# Patient Record
Sex: Female | Born: 1945 | Race: Black or African American | Hispanic: No | Smoking: Former smoker
Health system: Southern US, Community
[De-identification: ages and names within clinical notes are randomized; demographics above are authoritative.]

## PROBLEM LIST (undated history)

## (undated) DIAGNOSIS — B9620 Unspecified Escherichia coli [E. coli] as the cause of diseases classified elsewhere: Secondary | ICD-10-CM

## (undated) DIAGNOSIS — J80 Acute respiratory distress syndrome: Secondary | ICD-10-CM

## (undated) DIAGNOSIS — N12 Tubulo-interstitial nephritis, not specified as acute or chronic: Secondary | ICD-10-CM

## (undated) DIAGNOSIS — I10 Essential (primary) hypertension: Secondary | ICD-10-CM

## (undated) HISTORY — PX: CHOLECYSTECTOMY: SHX55

## (undated) HISTORY — PX: TUBAL LIGATION: SHX77

## (undated) HISTORY — DX: Tubulo-interstitial nephritis, not specified as acute or chronic: N12

## (undated) HISTORY — DX: Acute respiratory distress syndrome: J80

## (undated) HISTORY — DX: Unspecified Escherichia coli (E. coli) as the cause of diseases classified elsewhere: B96.20

## (undated) HISTORY — DX: Essential (primary) hypertension: I10

---

## 2001-03-09 ENCOUNTER — Encounter: Admission: RE | Admit: 2001-03-09 | Discharge: 2001-03-09 | Payer: Self-pay | Admitting: Otolaryngology

## 2001-03-09 ENCOUNTER — Encounter: Payer: Self-pay | Admitting: Otolaryngology

## 2005-10-10 HISTORY — PX: ROTATOR CUFF REPAIR: SHX139

## 2006-11-09 ENCOUNTER — Ambulatory Visit (HOSPITAL_COMMUNITY): Admission: RE | Admit: 2006-11-09 | Discharge: 2006-11-09 | Payer: Self-pay | Admitting: Pulmonary Disease

## 2006-11-09 ENCOUNTER — Encounter: Payer: Self-pay | Admitting: Orthopedic Surgery

## 2008-04-30 ENCOUNTER — Ambulatory Visit (HOSPITAL_COMMUNITY): Admission: RE | Admit: 2008-04-30 | Discharge: 2008-04-30 | Payer: Self-pay | Admitting: Internal Medicine

## 2008-05-06 ENCOUNTER — Ambulatory Visit (HOSPITAL_COMMUNITY): Admission: RE | Admit: 2008-05-06 | Discharge: 2008-05-06 | Payer: Self-pay | Admitting: Gastroenterology

## 2008-05-06 ENCOUNTER — Ambulatory Visit: Payer: Self-pay | Admitting: Gastroenterology

## 2008-10-10 DIAGNOSIS — J80 Acute respiratory distress syndrome: Secondary | ICD-10-CM

## 2008-10-10 DIAGNOSIS — B9620 Unspecified Escherichia coli [E. coli] as the cause of diseases classified elsewhere: Secondary | ICD-10-CM

## 2008-10-10 HISTORY — PX: TRACHEOSTOMY TUBE PLACEMENT: SHX814

## 2008-10-10 HISTORY — PX: TRACHEOSTOMY CLOSURE: SHX458

## 2008-10-10 HISTORY — DX: Acute respiratory distress syndrome: J80

## 2008-10-10 HISTORY — DX: Unspecified Escherichia coli (E. coli) as the cause of diseases classified elsewhere: B96.20

## 2009-09-01 ENCOUNTER — Inpatient Hospital Stay (HOSPITAL_COMMUNITY): Admission: AD | Admit: 2009-09-01 | Discharge: 2009-09-18 | Payer: Self-pay

## 2009-09-02 ENCOUNTER — Ambulatory Visit: Payer: Self-pay | Admitting: Emergency Medicine

## 2009-09-02 ENCOUNTER — Ambulatory Visit: Payer: Self-pay | Admitting: Internal Medicine

## 2009-09-08 ENCOUNTER — Ambulatory Visit: Payer: Self-pay | Admitting: Physical Medicine & Rehabilitation

## 2009-09-08 ENCOUNTER — Encounter: Payer: Self-pay | Admitting: Pulmonary Disease

## 2009-09-09 ENCOUNTER — Encounter: Payer: Self-pay | Admitting: Orthopedic Surgery

## 2009-09-09 ENCOUNTER — Ambulatory Visit: Payer: Self-pay | Admitting: Vascular Surgery

## 2009-09-14 ENCOUNTER — Encounter: Payer: Self-pay | Admitting: Internal Medicine

## 2009-09-18 ENCOUNTER — Encounter: Payer: Self-pay | Admitting: Orthopedic Surgery

## 2009-09-21 ENCOUNTER — Encounter: Payer: Self-pay | Admitting: Pulmonary Disease

## 2009-09-29 DIAGNOSIS — D696 Thrombocytopenia, unspecified: Secondary | ICD-10-CM | POA: Insufficient documentation

## 2009-09-29 DIAGNOSIS — E870 Hyperosmolality and hypernatremia: Secondary | ICD-10-CM | POA: Insufficient documentation

## 2009-09-29 DIAGNOSIS — E559 Vitamin D deficiency, unspecified: Secondary | ICD-10-CM | POA: Insufficient documentation

## 2009-09-29 DIAGNOSIS — N12 Tubulo-interstitial nephritis, not specified as acute or chronic: Secondary | ICD-10-CM | POA: Insufficient documentation

## 2009-09-29 DIAGNOSIS — J984 Other disorders of lung: Secondary | ICD-10-CM | POA: Insufficient documentation

## 2009-09-30 ENCOUNTER — Ambulatory Visit: Payer: Self-pay | Admitting: Pulmonary Disease

## 2009-10-12 ENCOUNTER — Telehealth: Payer: Self-pay | Admitting: Pulmonary Disease

## 2009-10-14 ENCOUNTER — Encounter: Payer: Self-pay | Admitting: Pulmonary Disease

## 2009-11-03 ENCOUNTER — Ambulatory Visit (HOSPITAL_COMMUNITY): Admission: RE | Admit: 2009-11-03 | Discharge: 2009-11-03 | Payer: Self-pay | Admitting: Urology

## 2009-11-18 ENCOUNTER — Ambulatory Visit (HOSPITAL_COMMUNITY): Admission: RE | Admit: 2009-11-18 | Discharge: 2009-11-18 | Payer: Self-pay | Admitting: Urology

## 2010-02-25 ENCOUNTER — Encounter: Payer: Self-pay | Admitting: Orthopedic Surgery

## 2010-03-04 ENCOUNTER — Ambulatory Visit: Payer: Self-pay | Admitting: Orthopedic Surgery

## 2010-03-04 DIAGNOSIS — M24539 Contracture, unspecified wrist: Secondary | ICD-10-CM

## 2010-03-04 DIAGNOSIS — M7580 Other shoulder lesions, unspecified shoulder: Secondary | ICD-10-CM

## 2010-03-11 ENCOUNTER — Encounter: Payer: Self-pay | Admitting: Orthopedic Surgery

## 2010-06-07 ENCOUNTER — Ambulatory Visit: Payer: Self-pay | Admitting: Orthopedic Surgery

## 2010-06-07 DIAGNOSIS — M75120 Complete rotator cuff tear or rupture of unspecified shoulder, not specified as traumatic: Secondary | ICD-10-CM | POA: Insufficient documentation

## 2010-06-15 ENCOUNTER — Encounter: Payer: Self-pay | Admitting: Orthopedic Surgery

## 2010-06-17 ENCOUNTER — Ambulatory Visit (HOSPITAL_COMMUNITY): Admission: RE | Admit: 2010-06-17 | Discharge: 2010-06-17 | Payer: Self-pay | Admitting: Orthopedic Surgery

## 2010-06-23 ENCOUNTER — Encounter: Payer: Self-pay | Admitting: Orthopedic Surgery

## 2010-06-24 ENCOUNTER — Ambulatory Visit: Payer: Self-pay | Admitting: Orthopedic Surgery

## 2010-06-25 ENCOUNTER — Encounter: Payer: Self-pay | Admitting: Orthopedic Surgery

## 2010-06-29 ENCOUNTER — Telehealth: Payer: Self-pay | Admitting: Orthopedic Surgery

## 2010-07-06 ENCOUNTER — Encounter: Payer: Self-pay | Admitting: Orthopedic Surgery

## 2010-07-29 ENCOUNTER — Encounter: Payer: Self-pay | Admitting: Orthopedic Surgery

## 2010-08-06 ENCOUNTER — Ambulatory Visit (HOSPITAL_BASED_OUTPATIENT_CLINIC_OR_DEPARTMENT_OTHER): Admission: RE | Admit: 2010-08-06 | Discharge: 2010-08-07 | Payer: Self-pay | Admitting: Orthopedic Surgery

## 2010-09-22 ENCOUNTER — Encounter (HOSPITAL_COMMUNITY)
Admission: RE | Admit: 2010-09-22 | Discharge: 2010-10-22 | Payer: Self-pay | Source: Home / Self Care | Attending: Orthopedic Surgery | Admitting: Orthopedic Surgery

## 2010-10-25 ENCOUNTER — Encounter
Admission: RE | Admit: 2010-10-25 | Discharge: 2010-11-09 | Payer: Self-pay | Source: Home / Self Care | Attending: Orthopedic Surgery | Admitting: Orthopedic Surgery

## 2010-10-31 ENCOUNTER — Encounter: Payer: Self-pay | Admitting: Internal Medicine

## 2010-11-09 NOTE — Assessment & Plan Note (Signed)
Summary: SAME PROBLEM,HAND,SHOULDER STILL HURTING/BCBS/BSF   Visit Type:  Follow-up Referring Provider:  Dr. Dwana Melena Primary Provider:  Dr. Catalina Pizza  CC:  right shoulder pain.  History of Present Illness: I saw Cheyenne Smith in the office today for a followup visit.  She is a 65 years old woman with the complaint of:  right shoulder pain.  Last seen 03-04-10 and received a cortisone injection for Impingement Syndrome. She was also sent for PT.  Medications: Azithromycin 250 mg, Tramadol 50 mg, Ciprofloxacin 500 mg.  Patient states that she does not want another shot, she says the shot did help some and therapy is not helping at all.  The patient has had I nerve conduction study which was normal she also had a cervical spine x-ray which showed mild degenerative cervical spondylosis C4 and 5 as well as C5 and 6.  Her shoulder films show a.c. joint arthritis.  This problem began after an IV infiltration in the hospital at [facility].  The full-thickness wound to the RIGHT arm no debridement was done and that was healed by secondary intention.  Her symptoms seem to be continuing so I would think an MRI is needed  Allergies: 1)  ! Penicillin  Physical Exam  Additional Exam:  RIGHT shoulder painful head elevation range of motion 120 active 160 passive.  Tenderness in the anterolateral acromion.  Shoulder is stable.  Strength in the cuff mild weakness.  Skin intact over the shoulder.  She's x3 mood and affect are normal overall appearance was normal.     Impression & Recommendations:  Problem # 1:  IMPINGEMENT SYNDROME (ICD-726.2) Assessment Deteriorated reviewed OT PT notes   Problem # 2:  ROTATOR CUFF TEAR (ICD-727.61) Assessment: New  Orders: Est. Patient Level III ([address])  Medications Added to Medication List This Visit: 1)  Norco 5-325 Mg Tabs (Hydrocodone-acetaminophen) .Marland Kitchen.. 1 q 6 h as needed pain  Patient Instructions: 1)  MRI Rt shoulder Rotator cuff tear   2)  refer back to Dr. Edilia Bo for re-evaluation of the right hand and forearm  3)  return after MRI  Prescriptions: NORCO 5-325 MG TABS (HYDROCODONE-ACETAMINOPHEN) 1 q 6 h as needed pain  #30 x 1   Entered and Authorized by:   Fuller Canada MD   Signed by:   Fuller Canada MD on 06/07/2010   Method used:   Print then Give to Patient   RxID:   [id number]

## 2010-11-09 NOTE — Progress Notes (Signed)
Summary: referral/ wound center  Phone Note Call from Patient   Caller: Patient Call For: alva Summary of Call: pt's friend called (on pt's behalf). says they need a phone # w/ dr's name- referral to wound center. says pt went to someone that she didn't like (says it wasn't the wound center). call marilyn [phone] Initial call taken by: Tivis Ringer,  October 12, 2009 10:50 AM  Follow-up for Phone Call        [facility] Carron Curie CMA  October 12, 2009 2:13 PM  spoke to pt and she states that she went to [facility]ound Center in [place] and needs the phone number.  I gave her the number [phone]. Carron Curie CMA  October 14, 2009 4:25 PM

## 2010-11-09 NOTE — Letter (Signed)
Summary: Hospital consultation  Hospital consultation   Imported By: Jacklynn Ganong 03/04/2010 14:10:47  _____________________________________________________________________  External Attachment:    Type:   Image     Comment:   External Document

## 2010-11-09 NOTE — Miscellaneous (Signed)
Summary: PT order  PT order   Imported By: Cammie Sickle 03/17/2010 17:53:08  _____________________________________________________________________  External Attachment:    Type:   Image     Comment:   External Document

## 2010-11-09 NOTE — Consult Note (Signed)
Summary: Consult note from Dr. Dion Saucier  Consult note from Dr. Dion Saucier   Imported By: Jacklynn Ganong 07/06/2010 11:01:55  _____________________________________________________________________  External Attachment:    Type:   Image     Comment:   External Document

## 2010-11-09 NOTE — Assessment & Plan Note (Signed)
Summary: mri results rt shoulder bringing disc.bcbs.cbt   Visit Type:  Follow-up Referring Provider:  Dr. Dwana Melena Primary Provider:  Dr. Catalina Pizza  CC:  rt shoulder mri results.  History of Present Illness: I saw Cheyenne Smith in the office today for a followup visit.  She is a 65 years old woman with the complaint of:  right shoulder pain.  WHILE SHE WAS IN THE HOSPITAL AN IV NFILTRATED INTO HER ARM AND SHE SUSTAINED A CHEMICAL BURN WHICH WAS TREATED WITH DRESSING CHANGES AND  IV ANTIBIOTICS.   SHE CAME TO [place] WITH SHOULDER PAIN.  Last seen 03-04-10 and received a cortisone injection for Impingement Syndrome. She was also sent for PT.  Medications:  Norco 5 for pain, some relief.  Patient states that she does not want another shot, she says the shot did help some and therapy is not helping at all.  THE MRI SHOWS:  IMPRESSION: Large full-thickness retracted tear of the distal supraspinatus tendon with mild atrophy of the supraspinatus muscle.  Allergies: 1)  ! Penicillin   Impression & Recommendations:  Problem # 1:  ROTATOR CUFF TEAR (ICD-727.61) Assessment Unchanged I advised her to have surgery; she refuses to have it at [facility]. she wants to be referred out! Orders: Orthopedic Surgeon Referral (Ortho Surgeon) Est. Patient Level II [id number])  Medications Added to Medication List This Visit: 1)  Norco 7.5-325 Mg Tabs (Hydrocodone-acetaminophen) .Marland Kitchen.. 1 by mouth q 4 hrs as needed pain  Patient Instructions: 1)  Refer to Dr. Dion Saucier for shoulder RCT needs surgery, wants [facility] instead of [facility] 2)  Refer to Dr. Amanda Pea for hand pain Prescriptions: NORCO 7.5-325 MG TABS (HYDROCODONE-ACETAMINOPHEN) 1 by mouth q 4 hrs as needed pain  #60 x 1   Entered and Authorized by:   Fuller Canada MD   Signed by:   Fuller Canada MD on 06/24/2010   Method used:   Print then Give to Patient   RxID:   [id number]

## 2010-11-09 NOTE — Letter (Signed)
Summary: Discharge Summary  Discharge Summary   Imported By: Jacklynn Ganong 03/04/2010 14:11:56  _____________________________________________________________________  External Attachment:    Type:   Image     Comment:   External Document

## 2010-11-09 NOTE — Letter (Signed)
Summary: *Orthopedic Consult Note  [facility]  [address]  [place], [place] [address]   Phone: [phone]  Fax: [phone]    Re:    Cheyenne Smith DOB:    July 23, 1946   Dear: Timothy Lasso   Thank you for requesting that we see the above patient for consultation.  A copy of the detailed office note will be sent under separate cover, for your review.  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  No followup needed.  Do not anticipate any surgery needed.       Thank you for this opportunity to look after your patient.  Sincerely,   Terrance Mass. MD.

## 2010-11-09 NOTE — Miscellaneous (Signed)
Summary: Hand therapy progress note  Hand therapy progress note   Imported By: Jacklynn Ganong 06/23/2010 09:14:13  _____________________________________________________________________  External Attachment:    Type:   Image     Comment:   External Document

## 2010-11-09 NOTE — Letter (Signed)
Summary: [facility]  [facility]   Imported By: Cammie Sickle 03/04/2010 12:21:06  _____________________________________________________________________  External Attachment:    Type:   Image     Comment:   External Document

## 2010-11-09 NOTE — Miscellaneous (Signed)
Summary: mri appt scheduled [facility] 06/17/10 7:40am  Clinical Lists Changes  u for results 06/24/10 at 11:15am to bring disc, precert number [id number] expires 30 days from 06/15/10, i scheduled mri for 06/17/10 at 7:40am

## 2010-11-09 NOTE — Progress Notes (Signed)
Summary: continue hand theraph?  Phone Note Call from Patient   Summary of Call: Cheyenne Smith (07/14/1946) is being referred to Dr. Dion Saucier for RTC and Dr Amanda Pea for her hand.  She wants to know if she is to continue the hand therapy. Her # [phone] Initial call taken by: Jacklynn Ganong,  June 29, 2010 11:35 AM  Follow-up for Phone Call        stop for now pending eval by dr Butler Denmark  Follow-up by: Fuller Canada MD,  June 29, 2010 4:09 PM  Additional Follow-up for Phone Call Additional follow up Details #1::        Patient advised, also [facility] with [facility] Additional Follow-up by: Jacklynn Ganong,  June 29, 2010 4:12 PM

## 2010-11-09 NOTE — Letter (Signed)
Summary: History form  History form   Imported By: Jacklynn Ganong 03/04/2010 14:09:18  _____________________________________________________________________  External Attachment:    Type:   Image     Comment:   External Document

## 2010-11-09 NOTE — Miscellaneous (Signed)
Summary: Rehab Report Initial   Rehab Report Initial   Imported By: Cammie Sickle 04/06/2010 10:28:13  _____________________________________________________________________  External Attachment:    Type:   Image     Comment:   External Document

## 2010-11-09 NOTE — Letter (Signed)
Summary: Medical record request Disab Determination  Medical record request Disab Determination   Imported By: Cammie Sickle 07/05/2010 12:06:47  _____________________________________________________________________  External Attachment:    Type:   Image     Comment:   External Document

## 2010-11-09 NOTE — Miscellaneous (Signed)
Summary: Care Plan/[facility]  Care Plan/[facility]   Imported By: Lester Gretna 10/19/2009 10:51:54  _____________________________________________________________________  External Attachment:    Type:   Image     Comment:   External Document

## 2010-11-09 NOTE — Miscellaneous (Signed)
Summary: hand therapy discharge  hand therapy discharge   Imported By: Jacklynn Ganong 08/04/2010 16:15:39  _____________________________________________________________________  External Attachment:    Type:   Image     Comment:   External Document

## 2010-11-09 NOTE — Assessment & Plan Note (Signed)
Summary: RT SHOULDER PAIN/NEEDS XRAY/REF Z.HALL/CAF   Vital Signs:  Patient profile:   65 year old female Height:      66 inches Weight:      174 pounds Pulse rate:   68 / minute Resp:     16 per minute  Vitals Entered By: Fuller Canada MD (Mar 04, 2010 9:37 AM)  Visit Type:  Initial Consult Referring Provider:  Dr. Dwana Melena Primary Provider:  Dr. Catalina Pizza  CC:  right shoulder pain.  History of Present Illness: I saw Cheyenne Smith in the office today for an initial visit.  She is a 65 years old woman with the complaint of:  right shoulder pain.  Interesting history on this patient she had infiltration of the medication in the hospital which resulted in wound in the RIGHT upper extremity which required treatment and then subsequent to that had stiffness and pain developed in the RIGHT upper extremity including the RIGHT shoulder.  She now complains of pain in the RIGHT upper extremity including the hand arm elbow shoulder areas with 7/10 in intensity which is constant worse when she moves her arm or hand or tries to elevate the shoulder.  She has some numbness and tingling but no swelling.  She reportedly had I nerve conduction study which was normal [facility]   RIGHT elbow x-ray which showed some degenerative changes this was done in 2008; cervical spine film at that time which showed mild degenerative cervical spondylosis at C4-C5 and C5 and 6, she had a shoulder film showed mild a.c. joint degenerative change lateral downsloping acromion with undersurface spurring of the acromion.  We see from a consultation done in 2010 which is related to her forearm wounds from the infiltration of vasopressin  These wounds were full thickness wound of the RIGHT forearm no debridement was done it was allowed to heal by secondary intention  Discharge summary indicates she had acute respiratory failure respiratory distress syndrome hypernatremia  dysphasia acute pyelonephritis with sepsis and anemia with thrombocytopenia    xrays today in our office.  Neck and rt shoulder films taken at [facility] 2008 for review.  Meds: Vicodin 5 and Cipro 500.  Notes from hospital from 08/2009 for review.  Allergies: 1)  ! Penicillin  Past History:  Past Surgical History: cholectystectomy in the 1980s bilateral tubal ligation cystostopy left pyelogram left stent placement in kidney  Family History: HTN: mother  Family History of Diabetes Family History Coronary Heart Disease female < 108 Family History of Arthritis Hx, family, asthma Hx, family, kidney disease NEC  Social History: Married Pt lives in [place]. Pt has 4 sons. Pt works as a Engineer, petroleum at [employer]. Patient states former smoker.  no alcohol use 2 cups of coffee twice per week.  Review of Systems Constitutional:  Denies weight loss, weight gain, fever, chills, and fatigue. Cardiovascular:  Denies chest pain, palpitations, fainting, and murmurs. Respiratory:  Denies short of breath, wheezing, couch, tightness, pain on inspiration, and snoring . Gastrointestinal:  Denies heartburn, nausea, vomiting, diarrhea, constipation, and blood in your stools. Genitourinary:  Denies frequency, urgency, difficulty urinating, painful urination, flank pain, and bleeding in urine. Neurologic:  Denies numbness, tingling, unsteady gait, dizziness, tremors, and seizure. Musculoskeletal:  Denies joint pain, swelling, instability, stiffness, redness, heat, and muscle pain. Endocrine:  Denies excessive thirst, exessive urination, and heat or cold intolerance. Psychiatric:  Denies nervousness, depression, anxiety, and hallucinations. Skin:  Denies changes  in the skin, poor healing, rash, itching, and redness. HEENT:  Denies blurred or double vision, eye pain, redness, and watering. Immunology:  Complains of seasonal allergies; denies sinus problems and allergic to  bee stings. Hemoatologic:  Denies easy bleeding and brusing.  Physical Exam  Skin:  the skin and the shoulder area normal RIGHT forearm shows 2 stellate-type wounds which have healed by secondary intention or non-tender negative Tinel's Cervical Nodes:  no significant adenopathy Axillary Nodes:  no significant adenopathy Psych:  alert and cooperative; normal mood and affect; normal attention span and concentration   Shoulder/Elbow Exam  General:    Well-developed, well-nourished, normal body habitus; no deformities, normal grooming.    Inspection:    no swelling ecchymosis or deformity around the shoulder or forearm or elbow or wrist or hand  Palpation:    tenderness around the RIGHT shoulder area primarily around the deltoid area  Vascular:    Radial, ulnar, brachial, and axillary pulses 2+ and symmetric; capillary refill less than 2 seconds; no evidence of ischemia, clubbing, or cyanosis.    Sensory:    Gross sensation intact in the upper extremities.    Motor:    there is weakness in wrist flexion normal elbow flexion extension power weakness in abduction weakness in external rotation and weakness in forward elevation RIGHT shoulder    Reflexes:    Normal reflexes in the upper extremities.    Shoulder Exam:    Right:    Inspection:  Abnormal    Palpation:  Abnormal    Stability:  stable    internal rotation to the front pocket    Range of Motion:       Flexion-Active: 70       External Rotation : 35       Interior Rotation :      Left:    Inspection:  Normal    Palpation:  Normal  Impingement Sign NEER:    Right positive Sulcus Sign:    Right negative   Impression & Recommendations:  Problem # 1:  IMPINGEMENT SYNDROME (ICD-726.2) Assessment New  I injected the subacromial space for bursitis, impingement, tendinitis  She also has contracture and adhesive capsulitis which will require physical therapy, no surgery needed  Orders: Consultation Level  III (28413) Depo- Medrol 40mg  (J1030) Joint Aspirate / Injection, Large (20610)  Problem # 2:  CONTRACTURE OF FOREARM JOINT (KGM-010.27) Assessment: New  Orders: Consultation Level III (25366)  Problem # 3:  CONTRACTURE OF HAND JOINT (YQI-347.42) Assessment: New  Orders: Consultation Level III (59563)  Patient Instructions: 1)  OT and PT  2)  You have received an injection of cortisone today. You may experience increased pain at the injection site. Apply ice pack to the area for 20 minutes every 2 hours and take 2 xtra strength tylenol every 8 hours. This increased pain will usually resolve in 24 hours. The injection will take effect in 3-10 days.  3)  Please schedule a follow-up appointment as needed. 4)  Limit activity to comfort and avoid activities that increase discomfort.  Apply moist heat and/or ice to shoulder and take medication as instructed for pain relief. Please read the Shoulder Pain Handout and start Physical Therapy as directed.

## 2010-11-09 NOTE — Progress Notes (Signed)
Summary: Referral to Dr. Dion Saucier.  Phone Note Outgoing Call   Call placed by: Waldon Reining,  June 29, 2010 10:55 AM Call placed to: Specialist Action Taken: Information Sent Summary of Call: I faxed a referral for this patient to Dr. Dion Saucier to be seen for rotator cuff tear.

## 2010-11-12 ENCOUNTER — Ambulatory Visit (HOSPITAL_COMMUNITY)
Admission: RE | Admit: 2010-11-12 | Discharge: 2010-11-12 | Disposition: A | Discharge status: Home / Self Care | Payer: BC Managed Care – PPO | Source: Ambulatory Visit | Attending: Orthopedic Surgery | Admitting: Orthopedic Surgery

## 2010-11-16 ENCOUNTER — Ambulatory Visit (HOSPITAL_COMMUNITY)
Admission: RE | Admit: 2010-11-16 | Discharge: 2010-11-16 | Disposition: A | Discharge status: Home / Self Care | Payer: BC Managed Care – PPO | Source: Ambulatory Visit | Attending: Orthopedic Surgery | Admitting: Orthopedic Surgery

## 2010-11-16 DIAGNOSIS — M6281 Muscle weakness (generalized): Secondary | ICD-10-CM | POA: Insufficient documentation

## 2010-11-16 DIAGNOSIS — M25619 Stiffness of unspecified shoulder, not elsewhere classified: Secondary | ICD-10-CM | POA: Insufficient documentation

## 2010-11-16 DIAGNOSIS — IMO0001 Reserved for inherently not codable concepts without codable children: Secondary | ICD-10-CM | POA: Insufficient documentation

## 2010-11-16 DIAGNOSIS — M25519 Pain in unspecified shoulder: Secondary | ICD-10-CM | POA: Insufficient documentation

## 2010-11-19 ENCOUNTER — Ambulatory Visit (HOSPITAL_COMMUNITY)
Admission: RE | Admit: 2010-11-19 | Discharge: 2010-11-19 | Disposition: A | Discharge status: Home / Self Care | Payer: BC Managed Care – PPO | Source: Ambulatory Visit | Attending: Urology | Admitting: Urology

## 2010-11-19 DIAGNOSIS — IMO0001 Reserved for inherently not codable concepts without codable children: Secondary | ICD-10-CM | POA: Insufficient documentation

## 2010-11-19 DIAGNOSIS — M6281 Muscle weakness (generalized): Secondary | ICD-10-CM | POA: Insufficient documentation

## 2010-11-19 DIAGNOSIS — M25619 Stiffness of unspecified shoulder, not elsewhere classified: Secondary | ICD-10-CM | POA: Insufficient documentation

## 2010-11-19 DIAGNOSIS — M25519 Pain in unspecified shoulder: Secondary | ICD-10-CM | POA: Insufficient documentation

## 2010-11-23 ENCOUNTER — Ambulatory Visit (HOSPITAL_COMMUNITY)
Admission: RE | Admit: 2010-11-23 | Discharge: 2010-11-23 | Disposition: A | Discharge status: Home / Self Care | Payer: BC Managed Care – PPO | Source: Ambulatory Visit | Attending: Orthopedic Surgery | Admitting: Orthopedic Surgery

## 2010-11-23 DIAGNOSIS — IMO0001 Reserved for inherently not codable concepts without codable children: Secondary | ICD-10-CM | POA: Insufficient documentation

## 2010-11-23 DIAGNOSIS — M25619 Stiffness of unspecified shoulder, not elsewhere classified: Secondary | ICD-10-CM | POA: Insufficient documentation

## 2010-11-23 DIAGNOSIS — M6281 Muscle weakness (generalized): Secondary | ICD-10-CM | POA: Insufficient documentation

## 2010-11-23 DIAGNOSIS — M25519 Pain in unspecified shoulder: Secondary | ICD-10-CM | POA: Insufficient documentation

## 2010-11-26 ENCOUNTER — Ambulatory Visit (HOSPITAL_COMMUNITY)
Admission: RE | Admit: 2010-11-26 | Discharge: 2010-11-26 | Disposition: A | Discharge status: Home / Self Care | Payer: BC Managed Care – PPO | Source: Ambulatory Visit | Attending: Orthopedic Surgery | Admitting: Orthopedic Surgery

## 2010-11-26 DIAGNOSIS — M25519 Pain in unspecified shoulder: Secondary | ICD-10-CM | POA: Insufficient documentation

## 2010-11-26 DIAGNOSIS — M6281 Muscle weakness (generalized): Secondary | ICD-10-CM | POA: Insufficient documentation

## 2010-11-26 DIAGNOSIS — IMO0001 Reserved for inherently not codable concepts without codable children: Secondary | ICD-10-CM | POA: Insufficient documentation

## 2010-11-26 DIAGNOSIS — M25619 Stiffness of unspecified shoulder, not elsewhere classified: Secondary | ICD-10-CM | POA: Insufficient documentation

## 2010-11-30 ENCOUNTER — Ambulatory Visit (HOSPITAL_COMMUNITY)
Admission: RE | Admit: 2010-11-30 | Discharge: 2010-11-30 | Disposition: A | Discharge status: Home / Self Care | Payer: BC Managed Care – PPO | Source: Ambulatory Visit | Attending: *Deleted | Admitting: *Deleted

## 2010-12-07 ENCOUNTER — Ambulatory Visit (HOSPITAL_COMMUNITY)
Admission: RE | Admit: 2010-12-07 | Discharge: 2010-12-07 | Disposition: A | Discharge status: Home / Self Care | Payer: BC Managed Care – PPO | Source: Ambulatory Visit | Attending: Orthopedic Surgery | Admitting: Orthopedic Surgery

## 2010-12-09 ENCOUNTER — Ambulatory Visit (HOSPITAL_COMMUNITY)
Admission: RE | Admit: 2010-12-09 | Discharge: 2010-12-09 | Disposition: A | Discharge status: Home / Self Care | Payer: BC Managed Care – PPO | Source: Ambulatory Visit | Attending: Orthopedic Surgery | Admitting: Orthopedic Surgery

## 2010-12-09 DIAGNOSIS — Z5189 Encounter for other specified aftercare: Secondary | ICD-10-CM | POA: Insufficient documentation

## 2010-12-09 DIAGNOSIS — M25519 Pain in unspecified shoulder: Secondary | ICD-10-CM | POA: Insufficient documentation

## 2010-12-09 DIAGNOSIS — M6281 Muscle weakness (generalized): Secondary | ICD-10-CM | POA: Insufficient documentation

## 2010-12-09 DIAGNOSIS — M25619 Stiffness of unspecified shoulder, not elsewhere classified: Secondary | ICD-10-CM | POA: Insufficient documentation

## 2010-12-14 ENCOUNTER — Ambulatory Visit (HOSPITAL_COMMUNITY)
Admission: RE | Admit: 2010-12-14 | Discharge: 2010-12-14 | Disposition: A | Discharge status: Home / Self Care | Payer: BC Managed Care – PPO | Source: Ambulatory Visit | Attending: Orthopedic Surgery | Admitting: Orthopedic Surgery

## 2010-12-16 ENCOUNTER — Ambulatory Visit (HOSPITAL_COMMUNITY)
Admission: RE | Admit: 2010-12-16 | Discharge: 2010-12-16 | Disposition: A | Discharge status: Home / Self Care | Payer: BC Managed Care – PPO | Source: Ambulatory Visit | Attending: Orthopedic Surgery | Admitting: Orthopedic Surgery

## 2010-12-21 ENCOUNTER — Ambulatory Visit (HOSPITAL_COMMUNITY)
Admission: RE | Admit: 2010-12-21 | Discharge: 2010-12-21 | Disposition: A | Discharge status: Home / Self Care | Payer: BC Managed Care – PPO | Source: Ambulatory Visit | Attending: Orthopedic Surgery | Admitting: Orthopedic Surgery

## 2010-12-22 LAB — POCT HEMOGLOBIN-HEMACUE: Hemoglobin: 12.8 g/dL (ref 12.0–15.0)

## 2010-12-23 ENCOUNTER — Ambulatory Visit (HOSPITAL_COMMUNITY)
Admission: RE | Admit: 2010-12-23 | Discharge: 2010-12-23 | Disposition: A | Discharge status: Home / Self Care | Payer: BC Managed Care – PPO | Source: Ambulatory Visit | Attending: *Deleted | Admitting: *Deleted

## 2010-12-29 ENCOUNTER — Ambulatory Visit (HOSPITAL_COMMUNITY)
Admission: RE | Admit: 2010-12-29 | Discharge: 2010-12-29 | Disposition: A | Discharge status: Home / Self Care | Payer: BC Managed Care – PPO | Source: Ambulatory Visit | Attending: *Deleted | Admitting: *Deleted

## 2010-12-30 ENCOUNTER — Ambulatory Visit (HOSPITAL_COMMUNITY)
Admission: RE | Admit: 2010-12-30 | Discharge: 2010-12-30 | Disposition: A | Discharge status: Home / Self Care | Payer: BC Managed Care – PPO | Source: Ambulatory Visit | Attending: *Deleted | Admitting: *Deleted

## 2010-12-30 LAB — BASIC METABOLIC PANEL
GFR calc Af Amer: >60 mL/min (ref >60)
GFR calc non Af Amer: >60 mL/min (ref >60)
Potassium: 3.3 mEq/L — ABNORMAL LOW (ref 3.5–5.1)
Sodium: 138 mEq/L (ref 135–145)

## 2010-12-30 LAB — CBC
HCT: 32.3 % — ABNORMAL LOW (ref 36.0–46.0)
Hemoglobin: 10.9 g/dL — ABNORMAL LOW (ref 12.0–15.0)
WBC: 6.3 10*3/uL (ref 4.0–10.5)

## 2011-01-04 ENCOUNTER — Ambulatory Visit (HOSPITAL_COMMUNITY)
Admission: RE | Admit: 2011-01-04 | Discharge: 2011-01-04 | Disposition: A | Discharge status: Home / Self Care | Payer: BC Managed Care – PPO | Source: Ambulatory Visit | Attending: *Deleted | Admitting: *Deleted

## 2011-01-06 ENCOUNTER — Ambulatory Visit (HOSPITAL_COMMUNITY)
Admission: RE | Admit: 2011-01-06 | Discharge: 2011-01-06 | Disposition: A | Discharge status: Home / Self Care | Payer: BC Managed Care – PPO | Source: Ambulatory Visit | Attending: *Deleted | Admitting: *Deleted

## 2011-01-11 ENCOUNTER — Ambulatory Visit (HOSPITAL_COMMUNITY)
Admission: RE | Admit: 2011-01-11 | Discharge: 2011-01-11 | Disposition: A | Discharge status: Home / Self Care | Payer: BC Managed Care – PPO | Source: Ambulatory Visit | Attending: Orthopedic Surgery | Admitting: Orthopedic Surgery

## 2011-01-11 DIAGNOSIS — M25619 Stiffness of unspecified shoulder, not elsewhere classified: Secondary | ICD-10-CM | POA: Insufficient documentation

## 2011-01-11 DIAGNOSIS — Z5189 Encounter for other specified aftercare: Secondary | ICD-10-CM | POA: Insufficient documentation

## 2011-01-11 DIAGNOSIS — M6281 Muscle weakness (generalized): Secondary | ICD-10-CM | POA: Insufficient documentation

## 2011-01-11 DIAGNOSIS — M25519 Pain in unspecified shoulder: Secondary | ICD-10-CM | POA: Insufficient documentation

## 2011-01-11 LAB — BASIC METABOLIC PANEL
BUN: 16 mg/dL (ref 6–23)
BUN: 25 mg/dL — ABNORMAL HIGH (ref 6–23)
BUN: 37 mg/dL — ABNORMAL HIGH (ref 6–23)
CO2: 24 mEq/L (ref 19–32)
CO2: 25 mEq/L (ref 19–32)
Calcium: 8.2 mg/dL — ABNORMAL LOW (ref 8.4–10.5)
Calcium: 8.4 mg/dL (ref 8.4–10.5)
Calcium: 8.6 mg/dL (ref 8.4–10.5)
Calcium: 8.7 mg/dL (ref 8.4–10.5)
Chloride: 107 mEq/L (ref 96–112)
Chloride: 110 mEq/L (ref 96–112)
Chloride: 113 mEq/L — ABNORMAL HIGH (ref 96–112)
Creatinine, Ser: 0.85 mg/dL (ref 0.4–1.2)
Creatinine, Ser: 0.89 mg/dL (ref 0.4–1.2)
Creatinine, Ser: 1.24 mg/dL — ABNORMAL HIGH (ref 0.4–1.2)
Creatinine, Ser: 1.39 mg/dL — ABNORMAL HIGH (ref 0.4–1.2)
GFR calc Af Amer: 46 mL/min — ABNORMAL LOW (ref >60)
GFR calc Af Amer: >60 mL/min (ref >60)
GFR calc Af Amer: >60 mL/min (ref >60)
GFR calc Af Amer: >60 mL/min (ref >60)
GFR calc Af Amer: >60 mL/min (ref >60)
GFR calc Af Amer: >60 mL/min (ref >60)
GFR calc non Af Amer: 38 mL/min — ABNORMAL LOW (ref >60)
GFR calc non Af Amer: 44 mL/min — ABNORMAL LOW (ref >60)
GFR calc non Af Amer: >60 mL/min (ref >60)
GFR calc non Af Amer: >60 mL/min (ref >60)
Glucose, Bld: 125 mg/dL — ABNORMAL HIGH (ref 70–99)
Potassium: 3 mEq/L — ABNORMAL LOW (ref 3.5–5.1)
Potassium: 3.1 mEq/L — ABNORMAL LOW (ref 3.5–5.1)
Potassium: 3.8 mEq/L (ref 3.5–5.1)
Potassium: 3.8 mEq/L (ref 3.5–5.1)
Potassium: 4.1 mEq/L (ref 3.5–5.1)
Sodium: 139 mEq/L (ref 135–145)
Sodium: 141 mEq/L (ref 135–145)
Sodium: 143 mEq/L (ref 135–145)
Sodium: 144 mEq/L (ref 135–145)

## 2011-01-11 LAB — CBC
HCT: 20.4 % — ABNORMAL LOW (ref 36.0–46.0)
HCT: 22.7 % — ABNORMAL LOW (ref 36.0–46.0)
HCT: 23.7 % — ABNORMAL LOW (ref 36.0–46.0)
HCT: 26.4 % — ABNORMAL LOW (ref 36.0–46.0)
Hemoglobin: 8 g/dL — ABNORMAL LOW (ref 12.0–15.0)
Hemoglobin: 8.8 g/dL — ABNORMAL LOW (ref 12.0–15.0)
MCHC: 33.3 g/dL (ref 30.0–36.0)
MCHC: 33.5 g/dL (ref 30.0–36.0)
MCV: 92.8 fL (ref 78.0–100.0)
MCV: 92.9 fL (ref 78.0–100.0)
Platelets: 148 10*3/uL — ABNORMAL LOW (ref 150–400)
Platelets: 167 10*3/uL (ref 150–400)
Platelets: 188 10*3/uL (ref 150–400)
Platelets: 381 10*3/uL (ref 150–400)
RBC: 2.34 MIL/uL — ABNORMAL LOW (ref 3.87–5.11)
RBC: 2.45 MIL/uL — ABNORMAL LOW (ref 3.87–5.11)
RBC: 2.47 MIL/uL — ABNORMAL LOW (ref 3.87–5.11)
RBC: 2.56 MIL/uL — ABNORMAL LOW (ref 3.87–5.11)
RBC: 2.56 MIL/uL — ABNORMAL LOW (ref 3.87–5.11)
RDW: 14.3 % (ref 11.5–15.5)
RDW: 16.3 % — ABNORMAL HIGH (ref 11.5–15.5)
WBC: 15.7 10*3/uL — ABNORMAL HIGH (ref 4.0–10.5)
WBC: 16.4 10*3/uL — ABNORMAL HIGH (ref 4.0–10.5)
WBC: 7.7 10*3/uL (ref 4.0–10.5)
WBC: 7.9 10*3/uL (ref 4.0–10.5)
WBC: 9.1 10*3/uL (ref 4.0–10.5)

## 2011-01-11 LAB — GLUCOSE, CAPILLARY
Glucose-Capillary: 104 mg/dL — ABNORMAL HIGH (ref 70–99)
Glucose-Capillary: 108 mg/dL — ABNORMAL HIGH (ref 70–99)
Glucose-Capillary: 111 mg/dL — ABNORMAL HIGH (ref 70–99)
Glucose-Capillary: 113 mg/dL — ABNORMAL HIGH (ref 70–99)
Glucose-Capillary: 120 mg/dL — ABNORMAL HIGH (ref 70–99)
Glucose-Capillary: 130 mg/dL — ABNORMAL HIGH (ref 70–99)
Glucose-Capillary: 98 mg/dL (ref 70–99)
Glucose-Capillary: 98 mg/dL (ref 70–99)

## 2011-01-11 LAB — MAGNESIUM
Magnesium: 1.5 mg/dL (ref 1.5–2.5)
Magnesium: 1.5 mg/dL (ref 1.5–2.5)

## 2011-01-11 LAB — BLOOD GAS, ARTERIAL
Bicarbonate: 28 mEq/L — ABNORMAL HIGH (ref 20.0–24.0)
FIO2: 0.28 %
O2 Saturation: 96.6 %
Patient temperature: 98.6
pH, Arterial: 7.472 — ABNORMAL HIGH (ref 7.350–7.400)

## 2011-01-11 LAB — IRON AND TIBC
Saturation Ratios: 9 % — ABNORMAL LOW (ref 20–55)
TIBC: 222 ug/dL — ABNORMAL LOW (ref 250–470)
UIBC: 201 ug/dL

## 2011-01-11 LAB — RETICULOCYTES: Retic Count, Absolute: 26.1 10*3/uL (ref 19.0–186.0)

## 2011-01-11 LAB — FOLATE: Folate: 10.7 ng/mL

## 2011-01-11 LAB — CLOSTRIDIUM DIFFICILE EIA
C difficile Toxins A+B, EIA: NEGATIVE
C difficile Toxins A+B, EIA: NEGATIVE

## 2011-01-11 LAB — PREPARE RBC (CROSSMATCH)

## 2011-01-11 LAB — FERRITIN: Ferritin: 372 ng/mL — ABNORMAL HIGH (ref 10–291)

## 2011-01-12 ENCOUNTER — Ambulatory Visit (HOSPITAL_COMMUNITY)
Admission: RE | Admit: 2011-01-12 | Discharge: 2011-01-12 | Disposition: A | Discharge status: Home / Self Care | Payer: BC Managed Care – PPO | Source: Ambulatory Visit | Admitting: Occupational Therapy

## 2011-01-12 LAB — FERRITIN: Ferritin: 839 ng/mL — ABNORMAL HIGH (ref 10–291)

## 2011-01-12 LAB — RETICULOCYTES
RBC.: 3.02 MIL/uL — ABNORMAL LOW (ref 3.87–5.11)
Retic Ct Pct: 1 % (ref 0.4–3.1)

## 2011-01-12 LAB — POCT I-STAT 3, ART BLOOD GAS (G3+)
Acid-Base Excess: 8 mmol/L — ABNORMAL HIGH (ref 0.0–2.0)
Acid-base deficit: 1 mmol/L (ref 0.0–2.0)
Acid-base deficit: 11 mmol/L — ABNORMAL HIGH (ref 0.0–2.0)
Acid-base deficit: 15 mmol/L — ABNORMAL HIGH (ref 0.0–2.0)
Acid-base deficit: 8 mmol/L — ABNORMAL HIGH (ref 0.0–2.0)
Bicarbonate: 16.8 mEq/L — ABNORMAL LOW (ref 20.0–24.0)
Bicarbonate: 22.1 mEq/L (ref 20.0–24.0)
O2 Saturation: 91 %
O2 Saturation: 98 %
O2 Saturation: 98 %
O2 Saturation: 98 %
Patient temperature: 97.6
Patient temperature: 97.7
Patient temperature: 97.8
Patient temperature: 98.6
Patient temperature: 98.6
TCO2: 14 mmol/L (ref 0–100)
TCO2: 23 mmol/L (ref 0–100)
TCO2: 32 mmol/L (ref 0–100)
pCO2 arterial: 29.2 mmHg — ABNORMAL LOW (ref 35.0–45.0)
pCO2 arterial: 32.7 mmHg — ABNORMAL LOW (ref 35.0–45.0)
pCO2 arterial: 33.9 mmHg — ABNORMAL LOW (ref 35.0–45.0)
pH, Arterial: 7.226 — ABNORMAL LOW (ref 7.350–7.400)
pH, Arterial: 7.279 — ABNORMAL LOW (ref 7.350–7.400)
pH, Arterial: 7.483 — ABNORMAL HIGH (ref 7.350–7.400)
pO2, Arterial: 108 mmHg — ABNORMAL HIGH (ref 80.0–100.0)

## 2011-01-12 LAB — CROSSMATCH
ABO/RH(D): O POS
Antibody Screen: NEGATIVE

## 2011-01-12 LAB — CULTURE, ROUTINE-ABSCESS

## 2011-01-12 LAB — BASIC METABOLIC PANEL
BUN: 46 mg/dL — ABNORMAL HIGH (ref 6–23)
BUN: 47 mg/dL — ABNORMAL HIGH (ref 6–23)
BUN: 52 mg/dL — ABNORMAL HIGH (ref 6–23)
BUN: 55 mg/dL — ABNORMAL HIGH (ref 6–23)
BUN: 59 mg/dL — ABNORMAL HIGH (ref 6–23)
BUN: 98 mg/dL — ABNORMAL HIGH (ref 6–23)
CO2: 14 mEq/L — ABNORMAL LOW (ref 19–32)
CO2: 20 mEq/L (ref 19–32)
CO2: 22 mEq/L (ref 19–32)
CO2: 24 mEq/L (ref 19–32)
CO2: 31 mEq/L (ref 19–32)
Calcium: 7.3 mg/dL — ABNORMAL LOW (ref 8.4–10.5)
Calcium: 7.5 mg/dL — ABNORMAL LOW (ref 8.4–10.5)
Calcium: 8 mg/dL — ABNORMAL LOW (ref 8.4–10.5)
Calcium: 8.2 mg/dL — ABNORMAL LOW (ref 8.4–10.5)
Calcium: 8.2 mg/dL — ABNORMAL LOW (ref 8.4–10.5)
Chloride: 108 mEq/L (ref 96–112)
Chloride: 112 mEq/L (ref 96–112)
Chloride: 115 mEq/L — ABNORMAL HIGH (ref 96–112)
Creatinine, Ser: 2.98 mg/dL — ABNORMAL HIGH (ref 0.4–1.2)
Creatinine, Ser: 4.22 mg/dL — ABNORMAL HIGH (ref 0.4–1.2)
Creatinine, Ser: 4.42 mg/dL — ABNORMAL HIGH (ref 0.4–1.2)
Creatinine, Ser: 4.66 mg/dL — ABNORMAL HIGH (ref 0.4–1.2)
GFR calc Af Amer: 12 mL/min — ABNORMAL LOW (ref >60)
GFR calc Af Amer: 12 mL/min — ABNORMAL LOW (ref >60)
GFR calc Af Amer: 19 mL/min — ABNORMAL LOW (ref >60)
GFR calc Af Amer: 19 mL/min — ABNORMAL LOW (ref >60)
GFR calc Af Amer: 25 mL/min — ABNORMAL LOW (ref >60)
GFR calc non Af Amer: 11 mL/min — ABNORMAL LOW (ref >60)
GFR calc non Af Amer: 12 mL/min — ABNORMAL LOW (ref >60)
GFR calc non Af Amer: 13 mL/min — ABNORMAL LOW (ref >60)
GFR calc non Af Amer: 16 mL/min — ABNORMAL LOW (ref >60)
Glucose, Bld: 147 mg/dL — ABNORMAL HIGH (ref 70–99)
Glucose, Bld: 148 mg/dL — ABNORMAL HIGH (ref 70–99)
Glucose, Bld: 187 mg/dL — ABNORMAL HIGH (ref 70–99)
Glucose, Bld: 190 mg/dL — ABNORMAL HIGH (ref 70–99)
Glucose, Bld: 81 mg/dL (ref 70–99)
Potassium: 3.1 mEq/L — ABNORMAL LOW (ref 3.5–5.1)
Potassium: 3.2 mEq/L — ABNORMAL LOW (ref 3.5–5.1)
Potassium: 3.8 mEq/L (ref 3.5–5.1)
Potassium: 5.5 mEq/L — ABNORMAL HIGH (ref 3.5–5.1)
Sodium: 135 mEq/L (ref 135–145)
Sodium: 148 mEq/L — ABNORMAL HIGH (ref 135–145)
Sodium: 148 mEq/L — ABNORMAL HIGH (ref 135–145)
Sodium: 148 mEq/L — ABNORMAL HIGH (ref 135–145)

## 2011-01-12 LAB — DIC (DISSEMINATED INTRAVASCULAR COAGULATION)PANEL
D-Dimer, Quant: >20 ug/mL-FEU — ABNORMAL HIGH (ref 0.00–0.48)
Fibrinogen: 779 mg/dL — ABNORMAL HIGH (ref 204–475)
Fibrinogen: >800 mg/dL — ABNORMAL HIGH (ref 204–475)
INR: 1.46 (ref 0.00–1.49)
Platelets: 28 10*3/uL — CL (ref 150–400)
Platelets: 41 10*3/uL — CL (ref 150–400)
Platelets: 76 10*3/uL — ABNORMAL LOW (ref 150–400)
Prothrombin Time: 15.8 seconds — ABNORMAL HIGH (ref 11.6–15.2)
Prothrombin Time: 18.3 seconds — ABNORMAL HIGH (ref 11.6–15.2)
Smear Review: NONE SEEN
aPTT: 39 seconds — ABNORMAL HIGH (ref 24–37)

## 2011-01-12 LAB — COMPREHENSIVE METABOLIC PANEL
ALT: 28 U/L (ref 0–35)
ALT: 35 U/L (ref 0–35)
AST: 35 U/L (ref 0–37)
AST: 40 U/L — ABNORMAL HIGH (ref 0–37)
AST: 48 U/L — ABNORMAL HIGH (ref 0–37)
Albumin: 1.9 g/dL — ABNORMAL LOW (ref 3.5–5.2)
Albumin: 2.5 g/dL — ABNORMAL LOW (ref 3.5–5.2)
Alkaline Phosphatase: 154 U/L — ABNORMAL HIGH (ref 39–117)
CO2: 28 mEq/L (ref 19–32)
Calcium: 7.4 mg/dL — ABNORMAL LOW (ref 8.4–10.5)
Chloride: 100 mEq/L (ref 96–112)
Chloride: 100 mEq/L (ref 96–112)
Creatinine, Ser: 2.83 mg/dL — ABNORMAL HIGH (ref 0.4–1.2)
Creatinine, Ser: 3.23 mg/dL — ABNORMAL HIGH (ref 0.4–1.2)
GFR calc Af Amer: 12 mL/min — ABNORMAL LOW (ref >60)
GFR calc Af Amer: 18 mL/min — ABNORMAL LOW (ref >60)
GFR calc Af Amer: 20 mL/min — ABNORMAL LOW (ref >60)
GFR calc non Af Amer: 10 mL/min — ABNORMAL LOW (ref >60)
Potassium: 3.1 mEq/L — ABNORMAL LOW (ref 3.5–5.1)
Sodium: 138 mEq/L (ref 135–145)
Sodium: 139 mEq/L (ref 135–145)
Total Bilirubin: 1.7 mg/dL — ABNORMAL HIGH (ref 0.3–1.2)
Total Bilirubin: 3.2 mg/dL — ABNORMAL HIGH (ref 0.3–1.2)
Total Protein: 5.4 g/dL — ABNORMAL LOW (ref 6.0–8.3)
Total Protein: 6.9 g/dL (ref 6.0–8.3)

## 2011-01-12 LAB — CBC
HCT: 19.1 % — ABNORMAL LOW (ref 36.0–46.0)
HCT: 22.4 % — ABNORMAL LOW (ref 36.0–46.0)
HCT: 25.4 % — ABNORMAL LOW (ref 36.0–46.0)
HCT: 27.2 % — ABNORMAL LOW (ref 36.0–46.0)
Hemoglobin: 7.6 g/dL — ABNORMAL LOW (ref 12.0–15.0)
Hemoglobin: 7.8 g/dL — ABNORMAL LOW (ref 12.0–15.0)
Hemoglobin: 8.2 g/dL — ABNORMAL LOW (ref 12.0–15.0)
Hemoglobin: 9.1 g/dL — ABNORMAL LOW (ref 12.0–15.0)
MCHC: 33.5 g/dL (ref 30.0–36.0)
MCHC: 33.9 g/dL (ref 30.0–36.0)
MCHC: 34 g/dL (ref 30.0–36.0)
MCHC: 34.6 g/dL (ref 30.0–36.0)
MCV: 93.9 fL (ref 78.0–100.0)
MCV: 94.8 fL (ref 78.0–100.0)
MCV: 96 fL (ref 78.0–100.0)
MCV: 97.1 fL (ref 78.0–100.0)
Platelets: 120 10*3/uL — ABNORMAL LOW (ref 150–400)
Platelets: 36 10*3/uL — CL (ref 150–400)
Platelets: 37 10*3/uL — CL (ref 150–400)
Platelets: 61 10*3/uL — ABNORMAL LOW (ref 150–400)
Platelets: 73 10*3/uL — ABNORMAL LOW (ref 150–400)
Platelets: 86 10*3/uL — ABNORMAL LOW (ref 150–400)
Platelets: DECREASED 10*3/uL (ref 150–400)
RBC: 1.99 MIL/uL — ABNORMAL LOW (ref 3.87–5.11)
RBC: 2.42 MIL/uL — ABNORMAL LOW (ref 3.87–5.11)
RBC: 2.73 MIL/uL — ABNORMAL LOW (ref 3.87–5.11)
RDW: 12.9 % (ref 11.5–15.5)
RDW: 13.5 % (ref 11.5–15.5)
RDW: 13.7 % (ref 11.5–15.5)
RDW: 14 % (ref 11.5–15.5)
RDW: 14.2 % (ref 11.5–15.5)
RDW: 14.4 % (ref 11.5–15.5)
RDW: 14.4 % (ref 11.5–15.5)
WBC: 18.9 10*3/uL — ABNORMAL HIGH (ref 4.0–10.5)
WBC: 19.9 10*3/uL — ABNORMAL HIGH (ref 4.0–10.5)
WBC: 25.8 10*3/uL — ABNORMAL HIGH (ref 4.0–10.5)
WBC: 4.9 10*3/uL (ref 4.0–10.5)

## 2011-01-12 LAB — URINE MICROSCOPIC-ADD ON

## 2011-01-12 LAB — GRAM STAIN

## 2011-01-12 LAB — LACTIC ACID, PLASMA
Lactic Acid, Venous: 4 mmol/L — ABNORMAL HIGH (ref 0.5–2.2)
Lactic Acid, Venous: 5.7 mmol/L — ABNORMAL HIGH (ref 0.5–2.2)

## 2011-01-12 LAB — PROTIME-INR
INR: 1.23 (ref 0.00–1.49)
INR: 1.52 — ABNORMAL HIGH (ref 0.00–1.49)
Prothrombin Time: 15.4 seconds — ABNORMAL HIGH (ref 11.6–15.2)
Prothrombin Time: 18.2 seconds — ABNORMAL HIGH (ref 11.6–15.2)

## 2011-01-12 LAB — BLOOD GAS, ARTERIAL
Bicarbonate: 14.6 mEq/L — ABNORMAL LOW (ref 20.0–24.0)
Bicarbonate: 15.1 mEq/L — ABNORMAL LOW (ref 20.0–24.0)
O2 Content: 100 L/min
O2 Saturation: 97.8 %
Patient temperature: 37
TCO2: 13.6 mmol/L (ref 0–100)
pH, Arterial: 7.312 — ABNORMAL LOW (ref 7.350–7.400)

## 2011-01-12 LAB — GLUCOSE, CAPILLARY
Glucose-Capillary: 112 mg/dL — ABNORMAL HIGH (ref 70–99)
Glucose-Capillary: 119 mg/dL — ABNORMAL HIGH (ref 70–99)
Glucose-Capillary: 136 mg/dL — ABNORMAL HIGH (ref 70–99)
Glucose-Capillary: 145 mg/dL — ABNORMAL HIGH (ref 70–99)
Glucose-Capillary: 151 mg/dL — ABNORMAL HIGH (ref 70–99)
Glucose-Capillary: 155 mg/dL — ABNORMAL HIGH (ref 70–99)
Glucose-Capillary: 169 mg/dL — ABNORMAL HIGH (ref 70–99)
Glucose-Capillary: 173 mg/dL — ABNORMAL HIGH (ref 70–99)
Glucose-Capillary: 262 mg/dL — ABNORMAL HIGH (ref 70–99)
Glucose-Capillary: 70 mg/dL (ref 70–99)

## 2011-01-12 LAB — CARBOXYHEMOGLOBIN
Carboxyhemoglobin: 1.1 % (ref 0.5–1.5)
Methemoglobin: 1.1 % (ref 0.0–1.5)
O2 Saturation: 86.4 %
Total hemoglobin: 8.9 g/dL — ABNORMAL LOW (ref 12.5–16.0)
Total hemoglobin: 9 g/dL — ABNORMAL LOW (ref 12.5–16.0)

## 2011-01-12 LAB — ABO/RH: ABO/RH(D): O POS

## 2011-01-12 LAB — URINALYSIS, ROUTINE W REFLEX MICROSCOPIC
Glucose, UA: NEGATIVE mg/dL
Protein, ur: >300 mg/dL — AB
Urobilinogen, UA: 4 mg/dL — ABNORMAL HIGH (ref 0.0–1.0)

## 2011-01-12 LAB — DIFFERENTIAL
Basophils Relative: 0 % (ref 0–1)
Eosinophils Absolute: 0 10*3/uL (ref 0.0–0.7)
Eosinophils Relative: 0 % (ref 0–5)
Eosinophils Relative: 1 % (ref 0–5)
Lymphocytes Relative: 2 % — ABNORMAL LOW (ref 12–46)
Lymphocytes Relative: 3 % — ABNORMAL LOW (ref 12–46)
Lymphs Abs: 0.5 10*3/uL — ABNORMAL LOW (ref 0.7–4.0)
Monocytes Absolute: 0 10*3/uL — ABNORMAL LOW (ref 0.1–1.0)
Monocytes Relative: 0 % — ABNORMAL LOW (ref 3–12)
Monocytes Relative: 15 % — ABNORMAL HIGH (ref 3–12)
Neutrophils Relative %: 96 % — ABNORMAL HIGH (ref 43–77)

## 2011-01-12 LAB — CULTURE, BAL-QUANTITATIVE W GRAM STAIN

## 2011-01-12 LAB — CARDIAC PANEL(CRET KIN+CKTOT+MB+TROPI)
CK, MB: 8.7 ng/mL — ABNORMAL HIGH (ref 0.3–4.0)
Relative Index: 7.8 — ABNORMAL HIGH (ref 0.0–2.5)
Troponin I: 3.16 ng/mL (ref 0.00–0.06)

## 2011-01-12 LAB — MAGNESIUM
Magnesium: 1.7 mg/dL (ref 1.5–2.5)
Magnesium: 1.9 mg/dL (ref 1.5–2.5)

## 2011-01-12 LAB — IRON AND TIBC: Iron: <10 ug/dL — ABNORMAL LOW (ref 42–135)

## 2011-01-12 LAB — HEPATIC FUNCTION PANEL
ALT: 34 U/L (ref 0–35)
AST: 46 U/L — ABNORMAL HIGH (ref 0–37)
Bilirubin, Direct: 1.8 mg/dL — ABNORMAL HIGH (ref 0.0–0.3)
Indirect Bilirubin: 1.3 mg/dL — ABNORMAL HIGH (ref 0.3–0.9)
Total Bilirubin: 3.1 mg/dL — ABNORMAL HIGH (ref 0.3–1.2)

## 2011-01-12 LAB — CULTURE, BLOOD (ROUTINE X 2)

## 2011-01-12 LAB — T4, FREE: Free T4: 1.3 ng/dL (ref 0.80–1.80)

## 2011-01-12 LAB — PHOSPHORUS: Phosphorus: 2.3 mg/dL (ref 2.3–4.6)

## 2011-01-12 LAB — VITAMIN B12: Vitamin B-12: 1556 pg/mL — ABNORMAL HIGH (ref 211–911)

## 2011-01-12 LAB — LIPASE, BLOOD: Lipase: 15 U/L (ref 11–59)

## 2011-01-18 ENCOUNTER — Ambulatory Visit (HOSPITAL_COMMUNITY): Payer: BC Managed Care – PPO | Admitting: Specialist

## 2011-01-20 ENCOUNTER — Ambulatory Visit (HOSPITAL_COMMUNITY): Payer: BC Managed Care – PPO | Admitting: Specialist

## 2011-01-25 ENCOUNTER — Ambulatory Visit (HOSPITAL_COMMUNITY): Payer: BC Managed Care – PPO | Admitting: Specialist

## 2011-01-27 ENCOUNTER — Ambulatory Visit (HOSPITAL_COMMUNITY): Payer: BC Managed Care – PPO | Admitting: Specialist

## 2011-02-01 ENCOUNTER — Ambulatory Visit (HOSPITAL_COMMUNITY): Payer: BC Managed Care – PPO | Admitting: Specialist

## 2011-02-03 ENCOUNTER — Ambulatory Visit (HOSPITAL_COMMUNITY): Payer: BC Managed Care – PPO | Admitting: Specialist

## 2011-02-16 ENCOUNTER — Other Ambulatory Visit: Payer: Self-pay | Admitting: Urology

## 2011-02-16 ENCOUNTER — Ambulatory Visit (INDEPENDENT_AMBULATORY_CARE_PROVIDER_SITE_OTHER): Payer: BC Managed Care – PPO | Admitting: Urology

## 2011-02-16 DIAGNOSIS — N1330 Unspecified hydronephrosis: Secondary | ICD-10-CM

## 2011-02-21 ENCOUNTER — Ambulatory Visit (HOSPITAL_COMMUNITY)
Admission: RE | Admit: 2011-02-21 | Discharge: 2011-02-21 | Disposition: A | Discharge status: Home / Self Care | Payer: BC Managed Care – PPO | Source: Ambulatory Visit | Attending: Urology | Admitting: Urology

## 2011-02-21 DIAGNOSIS — R9389 Abnormal findings on diagnostic imaging of other specified body structures: Secondary | ICD-10-CM | POA: Insufficient documentation

## 2011-02-21 DIAGNOSIS — Q6239 Other obstructive defects of renal pelvis and ureter: Secondary | ICD-10-CM | POA: Insufficient documentation

## 2011-02-22 NOTE — Op Note (Signed)
NAMEDELILAH, MULGREW            ACCOUNT NO.:  [id number]   MEDICAL RECORD NO.:  [id number]          PATIENT TYPE:  AMB   LOCATION:  DAY                           FACILITY:  [facility]   PHYSICIAN:  Kassie Mends, M.D.      DATE OF BIRTH:  Oct 09, 1946   DATE OF PROCEDURE:  05/06/2008  DATE OF DISCHARGE:                               OPERATIVE REPORT   REFERRING PHYSICIAN:  Catalina Pizza, MD   PROCEDURE:  Colonoscopy.   INDICATION FOR EXAM:  Ms. Cheyenne Smith is a 65 year old female who presents  for average risk colon cancer screening.   FINDINGS:  1. Normal colon without evidence of polyps, masses, inflammatory      changes, diverticula, or AVMs.  2. Normal retroflexed view of the rectum.   RECOMMENDATIONS:  1. Screening colonoscopy in 10 years.  2. High-fiber diet.  She is given a handout on high-fiber diet.   MEDICATIONS:  1. Demerol 50 mg IV.  2. Versed 4 mg IV.   PROCEDURE TECHNIQUE:  Physical exam was performed.  Informed consent was  obtained from the patient after explaining the benefits, risks, and  alternatives to the procedure.  The patient was connected to the monitor  and placed in left lateral position.  Continuous oxygen was provided by  nasal cannula and IV medicine was administered through an indwelling  cannula.  After administration of sedation and rectal exam, the  patient's rectum was intubated  and scope was advanced under direct visualization to the cecum.  The  scope was removed slowly by carefully examining the color, texture,  anatomy, and integrity of the mucosa on the way out.  The patient was  recovered in endoscopy and discharged home in satisfactory condition.      Kassie Mends, M.D.  Electronically Signed     SM/MEDQ  D:  05/06/2008  T:  05/07/2008  Job:  [id number]   cc:   Catalina Pizza, M.D.  Fax: [phone]

## 2011-08-04 ENCOUNTER — Other Ambulatory Visit (HOSPITAL_COMMUNITY): Payer: Self-pay | Admitting: Internal Medicine

## 2011-08-04 DIAGNOSIS — Z139 Encounter for screening, unspecified: Secondary | ICD-10-CM

## 2011-08-08 ENCOUNTER — Ambulatory Visit (HOSPITAL_COMMUNITY)
Admission: RE | Admit: 2011-08-08 | Discharge: 2011-08-08 | Disposition: A | Discharge status: Home / Self Care | Payer: BC Managed Care – PPO | Source: Ambulatory Visit | Attending: Internal Medicine | Admitting: Internal Medicine

## 2011-08-08 DIAGNOSIS — Z1231 Encounter for screening mammogram for malignant neoplasm of breast: Secondary | ICD-10-CM | POA: Insufficient documentation

## 2011-08-08 DIAGNOSIS — Z139 Encounter for screening, unspecified: Secondary | ICD-10-CM

## 2011-08-12 ENCOUNTER — Other Ambulatory Visit: Payer: Self-pay | Admitting: Internal Medicine

## 2011-08-12 DIAGNOSIS — R928 Other abnormal and inconclusive findings on diagnostic imaging of breast: Secondary | ICD-10-CM

## 2011-09-07 ENCOUNTER — Ambulatory Visit (HOSPITAL_COMMUNITY)
Admission: RE | Admit: 2011-09-07 | Discharge: 2011-09-07 | Disposition: A | Discharge status: Home / Self Care | Payer: BC Managed Care – PPO | Source: Ambulatory Visit | Attending: Internal Medicine | Admitting: Internal Medicine

## 2011-09-07 DIAGNOSIS — R928 Other abnormal and inconclusive findings on diagnostic imaging of breast: Secondary | ICD-10-CM | POA: Insufficient documentation

## 2011-09-07 DIAGNOSIS — L910 Hypertrophic scar: Secondary | ICD-10-CM | POA: Insufficient documentation

## 2011-12-06 ENCOUNTER — Telehealth (HOSPITAL_COMMUNITY): Payer: Self-pay | Admitting: Dietician

## 2011-12-06 NOTE — Telephone Encounter (Signed)
Pt reports she has no transportation. Consulted with pt over the phone for 40 minutes. Refer to documentation above for details.

## 2011-12-06 NOTE — Telephone Encounter (Signed)
Outpatient Initial Nutrition Assessment  Date:12/06/2011   Time: 9:37 AM  Referring Physician: Dr. Margo Aye Reason for Visit: obesity/weight loss  Nutrition Assessment:  Ht: 66" Wt: 209#   IBW: 130# %IBW: 161# UBW: unable to obtain %UBW: unable to obtain BMI: 33.73 Goal Weight: 188# (10% weight loss) Weight hx: Pt reports her current weight is the highest she has ever been, even when pregnant. She reports she has gradually started gaining weight since 11/10, due to multiple medical problems and complications.   Estimated nutritional needs: 1731-1888 kcals daily, 76-95 grams protein daily, 1.7-1.9 L fluid daily  PMH: anxiety disorder, HTN, Keloid, Hyperlipidemia, Anemia, Allergic Rhinitis, Pruritis, URI, Arthralgia- Shoulder, Dyslipidemia, Abdominal Pain, Heartburn, Joint Contracture- hand, Joint Contracture- forearm, Shoulder Region Dis Nec, Pyelonephritis NOS, cellulitis of arm  Medications: MVI, Calcium supplement, Hyzaar 50- 12.5 mg daily, Valium 5 mg TID, Roxilox 10-325 mg q 6 h prn, Phenergan 25 mg QID, Norco 5-325 mg every 8 hrs PRN, Ativan 1 mg BID, Temovate 0.05% apply QID PRN, Xylocaine Ointment 5% apply QID PRN, Ultram 50 mg PO QID Labs: CMP     Component Value Date/Time   NA 138 11/16/2009 1320   K 3.3* 11/16/2009 1320   CL 104 11/16/2009 1320   CO2 25 11/16/2009 1320   GLUCOSE 88 11/16/2009 1320   BUN 11 11/16/2009 1320   CREATININE 0.87 11/16/2009 1320   CALCIUM 9.8 11/16/2009 1320   PROT 5.2* 09/05/2009 0400   ALBUMIN 1.6* 09/05/2009 0400   AST 40* 09/05/2009 0400   ALT 28 09/05/2009 0400   ALKPHOS 154* 09/05/2009 0400   BILITOT 3.2* 09/05/2009 0400   GFRNONAA >60 11/16/2009 1320   GFRAA  Value: >60        The eGFR has been calculated using the MDRD equation. This calculation has not been validated in all clinical situations. eGFR's persistently <60 mL/min signify possible Chronic Kidney Disease. 11/16/2009 1320    Lipid Panel  No results found for this basename: chol, trig, hdl,  cholhdl, vldl, ldlcalc     No results found for this basename: HGBA1C   Lab Results  Component Value Date   CREATININE 0.87 11/16/2009    Per Dr. Scharlene Gloss records: Bp: 144/79, Glucose (fasting) 89, Total Cholesterol: 223, HDL: 70, LDL: 135, Triglycerides: 89  Lifestyle/ social habits: Ms. Cheyenne Smith lives alone in [place]. She is originally from [place]. She has 4 grown sons. 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 She has no transportation; she reports she walks around town daily.   Nutrition hx/habits: Ms, Cheyenne Smith desires weight loss. She reports that she has been gaining weight since menopause, but weight has increased since 11/10, when she has multiple illnesses and hospitalizations. She reports that due to her illnesses she was unable to be as active as she was before. She reports she has been improving her eating habits by eating fish two times a week and eating a lot of fruits and vegetables. She is frustrated with her weight. She reports "I eat and I don't eat, but I won't lose weight". She reports she does not cook frequently because she lives alone and it is hard to cook for one person. She drinks mostly water, seltzer water, and herbal tea. She reports she frequently walks around town. She has an exercise bike. She reports that when she was at Dr. Scharlene Gloss office, the nurse told her to keep a food diary and follow a 1200 kcal diet.  She reports that she usually eats about 800 kcals daily. She wants to know if she is "on the right track" for a diet for weight loss.   Diet recall: She reports she eats breakfast daily. For lunch, she sometimes will eat only a baked sweet potato with cinnamon. She admits to frequently skipping meals due to being tired. She reports she eats a lot of fruits and vegetables. She reports "I only eat healthy foods. Most people will eat one apple, but I will eat two. It's better than eating a burger".   Nutrition  Diagnosis: Involuntary weight gain r/t disordered eating pattern AEB pt admits to skipping meals and eating large portions.   Nutrition Intervention: Nutrition rx: 1200 kcal NAS, no added sugar diet; 3 meals per day; include 1 starch, protein, and fruit or vegetable at each meal; low calorie beverages only; 30 minutes physical activity daily  Education/Counseling Provided: Discussed weight loss at length with pt. Explained to patient principles of energy expenditure as they relate to diet and exercise. Discussed with pt the importance of portion control and regular exercise. Discussed importance of eating 3 meals per day. Discussed ideas for exercise when she is unable to walk outside. Encouraged pt to continue with food diary. Educated pt on slow, moderate weight loss of 1-2# per week, achievable by a healthy diet and regular physical activity.   Understanding, Motivation, Ability to Follow Recommendations: Expect fair compliance.   Monitoring and Evaluation: Goals: 1) 1-2# weight loss per week; 2) 30 minutes physical activity daily  Recommendations: 1) For weight loss: 1231-1388 kcals daily; 2) Continue with food diary; 3) Use measuring cups to ensure proper portions; 4) Utilize exercise bike when weather is not fit for walking; break exercise up into smaller, more frequent sessions.   F/U: PRN. Provided RD contact information.   Orlene Plum, RD  12/06/2011  Time: 9:37 AM

## 2011-12-06 NOTE — Telephone Encounter (Signed)
Received referral from Dr. Margo Aye on 10/04/12 for dx: obesity.

## 2012-02-21 ENCOUNTER — Ambulatory Visit (INDEPENDENT_AMBULATORY_CARE_PROVIDER_SITE_OTHER): Payer: Medicare Other | Admitting: Urology

## 2012-02-21 DIAGNOSIS — N1330 Unspecified hydronephrosis: Secondary | ICD-10-CM

## 2012-05-11 ENCOUNTER — Encounter: Payer: Self-pay | Admitting: Family Medicine

## 2012-05-11 ENCOUNTER — Ambulatory Visit (INDEPENDENT_AMBULATORY_CARE_PROVIDER_SITE_OTHER): Payer: Medicare Other | Admitting: Family Medicine

## 2012-05-11 VITALS — BP 130/78 | HR 71 | Resp 18 | Ht 65.5 in | Wt 198.1 lb

## 2012-05-11 DIAGNOSIS — I10 Essential (primary) hypertension: Secondary | ICD-10-CM

## 2012-05-11 DIAGNOSIS — E559 Vitamin D deficiency, unspecified: Secondary | ICD-10-CM

## 2012-05-11 DIAGNOSIS — L910 Hypertrophic scar: Secondary | ICD-10-CM

## 2012-05-11 DIAGNOSIS — E785 Hyperlipidemia, unspecified: Secondary | ICD-10-CM

## 2012-05-11 DIAGNOSIS — M1990 Unspecified osteoarthritis, unspecified site: Secondary | ICD-10-CM

## 2012-05-11 LAB — COMPREHENSIVE METABOLIC PANEL
ALT: 26 U/L (ref 0–35)
AST: 23 U/L (ref 0–37)
Alkaline Phosphatase: 116 U/L (ref 39–117)
CO2: 29 mEq/L (ref 19–32)
Sodium: 142 mEq/L (ref 135–145)
Total Bilirubin: 0.4 mg/dL (ref 0.3–1.2)
Total Protein: 7.5 g/dL (ref 6.0–8.3)

## 2012-05-11 LAB — LIPID PANEL
Cholesterol: 250 mg/dL — ABNORMAL HIGH (ref 0–200)
HDL: 76 mg/dL (ref >39)
Total CHOL/HDL Ratio: 3.3 Ratio
Triglycerides: 124 mg/dL (ref <150)

## 2012-05-11 LAB — TSH: TSH: 0.416 u[IU]/mL (ref 0.350–4.500)

## 2012-05-11 LAB — CBC
HCT: 38.9 % (ref 36.0–46.0)
Hemoglobin: 12.8 g/dL (ref 12.0–15.0)
MCHC: 32.9 g/dL (ref 30.0–36.0)
RBC: 4.06 MIL/uL (ref 3.87–5.11)
WBC: 5.8 10*3/uL (ref 4.0–10.5)
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
## 2012-05-11 NOTE — Patient Instructions (Addendum)
Get the labs done today-we will call with results  Continue your current medications I will get your records F/U 3 months

## 2012-05-12 ENCOUNTER — Encounter: Payer: Self-pay | Admitting: Family Medicine

## 2012-05-12 DIAGNOSIS — I10 Essential (primary) hypertension: Secondary | ICD-10-CM | POA: Insufficient documentation

## 2012-05-12 DIAGNOSIS — E785 Hyperlipidemia, unspecified: Secondary | ICD-10-CM | POA: Insufficient documentation

## 2012-05-12 DIAGNOSIS — M1990 Unspecified osteoarthritis, unspecified site: Secondary | ICD-10-CM | POA: Insufficient documentation

## 2012-05-12 DIAGNOSIS — L910 Hypertrophic scar: Secondary | ICD-10-CM | POA: Insufficient documentation

## 2012-05-12 NOTE — Assessment & Plan Note (Signed)
No change to meds well control, check labs and FLP

## 2012-05-12 NOTE — Assessment & Plan Note (Signed)
No known CAD, work on diet changes before starting medications

## 2012-05-12 NOTE — Assessment & Plan Note (Signed)
Treatment per dermatology 

## 2012-06-05 ENCOUNTER — Other Ambulatory Visit: Payer: Self-pay | Admitting: Urology

## 2012-06-05 DIAGNOSIS — N1330 Unspecified hydronephrosis: Secondary | ICD-10-CM

## 2012-07-10 ENCOUNTER — Ambulatory Visit (HOSPITAL_COMMUNITY): Payer: Medicare Other

## 2012-07-12 ENCOUNTER — Ambulatory Visit (HOSPITAL_COMMUNITY)
Admission: RE | Admit: 2012-07-12 | Discharge: 2012-07-12 | Disposition: A | Discharge status: Home / Self Care | Payer: Medicare Other | Source: Ambulatory Visit | Attending: Urology | Admitting: Urology

## 2012-07-12 DIAGNOSIS — N1330 Unspecified hydronephrosis: Secondary | ICD-10-CM

## 2012-07-12 DIAGNOSIS — R9389 Abnormal findings on diagnostic imaging of other specified body structures: Secondary | ICD-10-CM | POA: Insufficient documentation

## 2012-07-30 ENCOUNTER — Telehealth: Payer: Self-pay | Admitting: Family Medicine

## 2012-07-30 MED ORDER — LOSARTAN POTASSIUM-HCTZ 50-12.5 MG PO TABS: 1.0000 | ORAL_TABLET | Freq: Every day | ORAL | Status: DC

## 2012-07-30 MED ORDER — DICLOFENAC SODIUM 1 % TD GEL: 1.0000 "application " | Freq: Four times a day (QID) | TRANSDERMAL | Status: AC | PRN

## 2012-07-30 NOTE — Telephone Encounter (Signed)
Hasn't been refilled from here yet. Ok to send in to [facility]?

## 2012-07-30 NOTE — Telephone Encounter (Signed)
[facility]

## 2012-07-30 NOTE — Telephone Encounter (Signed)
Medications may be refilled

## 2012-08-14 ENCOUNTER — Encounter: Payer: Self-pay | Admitting: Family Medicine

## 2012-08-14 ENCOUNTER — Ambulatory Visit (INDEPENDENT_AMBULATORY_CARE_PROVIDER_SITE_OTHER): Payer: Medicare Other | Admitting: Family Medicine

## 2012-08-14 VITALS — BP 126/72 | HR 70 | Resp 18 | Ht 65.5 in | Wt 201.0 lb

## 2012-08-14 DIAGNOSIS — E785 Hyperlipidemia, unspecified: Secondary | ICD-10-CM

## 2012-08-14 DIAGNOSIS — E669 Obesity, unspecified: Secondary | ICD-10-CM | POA: Insufficient documentation

## 2012-08-14 DIAGNOSIS — I10 Essential (primary) hypertension: Secondary | ICD-10-CM

## 2012-08-14 DIAGNOSIS — R0982 Postnasal drip: Secondary | ICD-10-CM

## 2012-08-14 DIAGNOSIS — H269 Unspecified cataract: Secondary | ICD-10-CM | POA: Insufficient documentation

## 2012-08-14 MED ORDER — FLUTICASONE PROPIONATE 50 MCG/ACT NA SUSP: 2.0000 | Freq: Every day | NASAL | Status: DC

## 2012-08-14 MED ORDER — LORATADINE 10 MG PO TABS: 10.0000 mg | ORAL_TABLET | Freq: Every day | ORAL | Status: DC

## 2012-08-14 NOTE — Patient Instructions (Addendum)
Flu shot given Continue blood pressure medications Get the labs done before the next visit- fasting Add flonase  Stop zyrtec Try claritin Call the eye doctor for appointment F/U 3 months
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
## 2012-08-14 NOTE — Assessment & Plan Note (Signed)
Schedule with eye doctor

## 2012-08-14 NOTE — Assessment & Plan Note (Signed)
Currently working on diet, no current medications

## 2012-08-14 NOTE — Assessment & Plan Note (Signed)
Discussed diet and exercise 

## 2012-08-14 NOTE — Assessment & Plan Note (Signed)
Change to claritin and trial of flonase

## 2012-08-14 NOTE — Assessment & Plan Note (Signed)
Well controlled, continue meds, check fasting labs at next visit

## 2012-10-11 ENCOUNTER — Telehealth: Payer: Self-pay | Admitting: Family Medicine

## 2012-10-12 ENCOUNTER — Telehealth: Payer: Self-pay | Admitting: Family Medicine

## 2012-10-12 NOTE — Telephone Encounter (Signed)
Noted  

## 2012-10-12 NOTE — Telephone Encounter (Signed)
Lab requisition faxed over to lab and patient aware that she should be fasting.  Will address other concerns in office visit.

## 2012-10-16 LAB — LIPID PANEL
LDL Cholesterol: 146 mg/dL — ABNORMAL HIGH (ref 0–99)
Triglycerides: 80 mg/dL (ref <150)
VLDL: 16 mg/dL (ref 0–40)

## 2012-10-16 LAB — CBC
HCT: 36.6 % (ref 36.0–46.0)
Platelets: 285 10*3/uL (ref 150–400)
RBC: 3.98 MIL/uL (ref 3.87–5.11)
RDW: 12.3 % (ref 11.5–15.5)
WBC: 5.3 10*3/uL (ref 4.0–10.5)

## 2012-10-16 LAB — COMPREHENSIVE METABOLIC PANEL
ALT: 14 U/L (ref 0–35)
CO2: 30 mEq/L (ref 19–32)
Calcium: 9.8 mg/dL (ref 8.4–10.5)
Chloride: 104 mEq/L (ref 96–112)
Creat: 1.15 mg/dL — ABNORMAL HIGH (ref 0.50–1.10)
Glucose, Bld: 104 mg/dL — ABNORMAL HIGH (ref 70–99)

## 2012-10-29 ENCOUNTER — Encounter: Payer: Self-pay | Admitting: Family Medicine

## 2012-10-29 ENCOUNTER — Ambulatory Visit (INDEPENDENT_AMBULATORY_CARE_PROVIDER_SITE_OTHER): Payer: Medicare Other | Admitting: Family Medicine

## 2012-10-29 VITALS — BP 140/74 | HR 64 | Resp 16 | Ht 65.0 in | Wt 203.0 lb

## 2012-10-29 DIAGNOSIS — I10 Essential (primary) hypertension: Secondary | ICD-10-CM

## 2012-10-29 DIAGNOSIS — M1990 Unspecified osteoarthritis, unspecified site: Secondary | ICD-10-CM

## 2012-10-29 DIAGNOSIS — N950 Postmenopausal bleeding: Secondary | ICD-10-CM

## 2012-10-29 DIAGNOSIS — E785 Hyperlipidemia, unspecified: Secondary | ICD-10-CM

## 2012-10-29 DIAGNOSIS — H6120 Impacted cerumen, unspecified ear: Secondary | ICD-10-CM

## 2012-10-29 DIAGNOSIS — R0982 Postnasal drip: Secondary | ICD-10-CM

## 2012-10-29 MED ORDER — SIMVASTATIN 20 MG PO TABS: 20.0000 mg | ORAL_TABLET | Freq: Every evening | ORAL | Status: DC

## 2012-10-29 MED ORDER — LOSARTAN POTASSIUM-HCTZ 50-12.5 MG PO TABS: 1.0000 | ORAL_TABLET | Freq: Every day | ORAL | Status: DC

## 2012-10-29 NOTE — Patient Instructions (Addendum)
Referral to GYN for the vaginal bleeding  Ultrasound done  Right ear irrigated Ultram for the for arthritis twice as a day as needed Restart flonase  New cholesterol medication F/U 3 months

## 2012-10-30 ENCOUNTER — Telehealth: Payer: Self-pay | Admitting: Family Medicine

## 2012-10-30 MED ORDER — LORATADINE 10 MG PO TABS: 10.0000 mg | ORAL_TABLET | Freq: Every day | ORAL | Status: DC

## 2012-10-30 MED ORDER — TRAMADOL HCL 50 MG PO TABS: 50.0000 mg | ORAL_TABLET | Freq: Two times a day (BID) | ORAL | Status: DC | PRN

## 2012-10-30 MED ORDER — FLUTICASONE PROPIONATE 50 MCG/ACT NA SUSP: 2.0000 | Freq: Every day | NASAL | Status: AC

## 2012-10-30 NOTE — Telephone Encounter (Signed)
I resent in the requested meds but I see in the discharge that she is supposed to restart Ultram but none was sent. Does she already have this?

## 2012-10-30 NOTE — Telephone Encounter (Signed)
Medication sent.

## 2012-11-01 ENCOUNTER — Encounter: Payer: Self-pay | Admitting: Family Medicine

## 2012-11-01 DIAGNOSIS — H6120 Impacted cerumen, unspecified ear: Secondary | ICD-10-CM | POA: Insufficient documentation

## 2012-11-01 DIAGNOSIS — N950 Postmenopausal bleeding: Secondary | ICD-10-CM | POA: Insufficient documentation

## 2012-11-01 NOTE — Assessment & Plan Note (Signed)
Ultrasound to be done, referral to GYN

## 2012-11-01 NOTE — Assessment & Plan Note (Signed)
Start zocor

## 2012-11-01 NOTE — Assessment & Plan Note (Signed)
Start ultram for pain

## 2012-11-01 NOTE — Assessment & Plan Note (Signed)
Restart flonase 

## 2012-11-01 NOTE — Progress Notes (Signed)
  Subjective:    Patient ID: Cheyenne Smith, female    DOB: Oct 02, 1946, 67 y.o.   MRN: [id number]  HPI Pt here with multiple complaints Shoulder and hip pain, similar to past, over the counter meds not helping worse with weather change, no falls Allergies- out of flonase, continues to have some runny nose and itchy eyes, sneezing Noticed vaginal bleeding a few weeks ago, no abdominal pain, no cramping this has happened twice ( mentioned at end of exam) HTN- tolerating medications Difficulty hearing out of her right ear, feels clogged  Review of Systems   GEN- denies fatigue, fever, weight loss,weakness, recent illness HEENT- denies eye drainage, change in vision, nasal discharge, CVS- denies chest pain, palpitations RESP- denies SOB, cough, wheeze ABD- denies N/V, change in stools, abd pain GU- denies dysuria, hematuria, dribbling, incontinence MSK- + joint pain, muscle aches, injury Neuro- denies headache, dizziness, syncope, seizure activity      Objective:   Physical Exam GEN- NAD, alert and oriented x3 HEENT- PERRL, EOMI, non injected sclera, pink conjunctiva, MMM, oropharynx clear, impacted wax right ear  Neck- Supple,  CVS- RRR, no murmur RESP-CTAB EXT- No edema Pulses- Radial, DP- 2+ MSK- normal ROM hip, fair ROM right shoulder- rotator cuff in tact, no impingement signs       Assessment & Plan:

## 2012-11-01 NOTE — Assessment & Plan Note (Signed)
Bp elevated some today, previously well controlled

## 2012-11-01 NOTE — Assessment & Plan Note (Signed)
S/p irrigation, improvement in hearing

## 2012-11-02 ENCOUNTER — Telehealth: Payer: Self-pay | Admitting: Family Medicine

## 2012-11-02 ENCOUNTER — Other Ambulatory Visit: Payer: Self-pay | Admitting: Family Medicine

## 2012-11-02 DIAGNOSIS — Z139 Encounter for screening, unspecified: Secondary | ICD-10-CM

## 2012-11-02 NOTE — Telephone Encounter (Signed)
meds sent 1/21

## 2012-11-02 NOTE — Telephone Encounter (Signed)
See previous message

## 2012-11-06 ENCOUNTER — Other Ambulatory Visit: Payer: Self-pay | Admitting: Family Medicine

## 2012-11-06 ENCOUNTER — Ambulatory Visit (HOSPITAL_COMMUNITY): Payer: Medicare Other

## 2012-11-06 ENCOUNTER — Ambulatory Visit (HOSPITAL_COMMUNITY)
Admission: RE | Admit: 2012-11-06 | Discharge: 2012-11-06 | Disposition: A | Discharge status: Home / Self Care | Payer: Medicare Other | Source: Ambulatory Visit | Attending: Family Medicine | Admitting: Family Medicine

## 2012-11-06 DIAGNOSIS — N950 Postmenopausal bleeding: Secondary | ICD-10-CM | POA: Insufficient documentation

## 2012-11-06 DIAGNOSIS — R9389 Abnormal findings on diagnostic imaging of other specified body structures: Secondary | ICD-10-CM | POA: Insufficient documentation

## 2012-11-06 DIAGNOSIS — Z139 Encounter for screening, unspecified: Secondary | ICD-10-CM

## 2012-11-06 DIAGNOSIS — Z1231 Encounter for screening mammogram for malignant neoplasm of breast: Secondary | ICD-10-CM | POA: Insufficient documentation

## 2012-11-07 NOTE — Addendum Note (Signed)
Addended by: Milinda Antis F on: 11/07/2012 03:53 PM   Modules accepted: Orders

## 2012-11-08 ENCOUNTER — Telehealth: Payer: Self-pay | Admitting: Family Medicine

## 2012-11-08 ENCOUNTER — Other Ambulatory Visit: Payer: Self-pay

## 2012-11-08 MED ORDER — LORATADINE 10 MG PO TABS: 10.0000 mg | ORAL_TABLET | Freq: Every day | ORAL | Status: AC

## 2012-11-08 MED ORDER — TRAMADOL HCL 50 MG PO TABS: 50.0000 mg | ORAL_TABLET | Freq: Two times a day (BID) | ORAL | Status: DC | PRN

## 2012-11-08 NOTE — Telephone Encounter (Signed)
Spoke with patient and addressed concerns about rx and results.

## 2012-11-15 ENCOUNTER — Ambulatory Visit: Payer: BC Managed Care – PPO | Admitting: Family Medicine

## 2012-12-14 IMAGING — US US RENAL
1 series · 14 of 25 positions shown · non-contrast
Comparison: Prior renal ultrasound 02/21/2011

CLINICAL DATA: Hydronephrosis

RENAL/URINARY TRACT ULTRASOUND COMPLETE

[Series 1: us renal · 0.23mm/px · 14 of 46 slices shown]
[im 1/46]
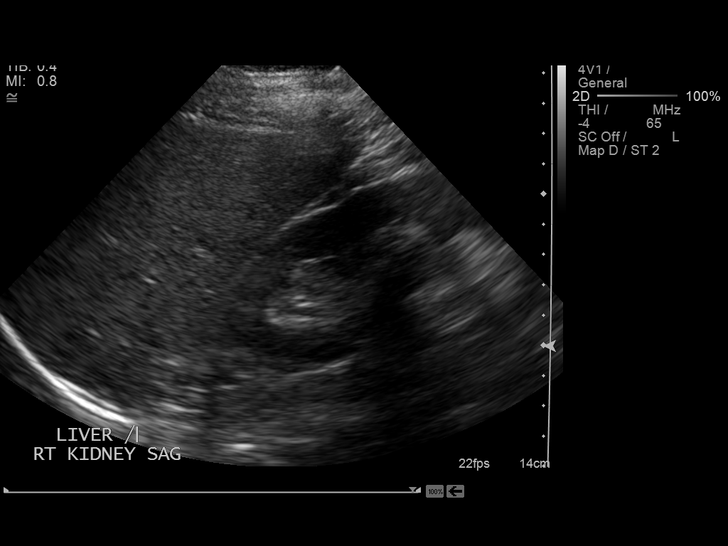
[im 4/46]
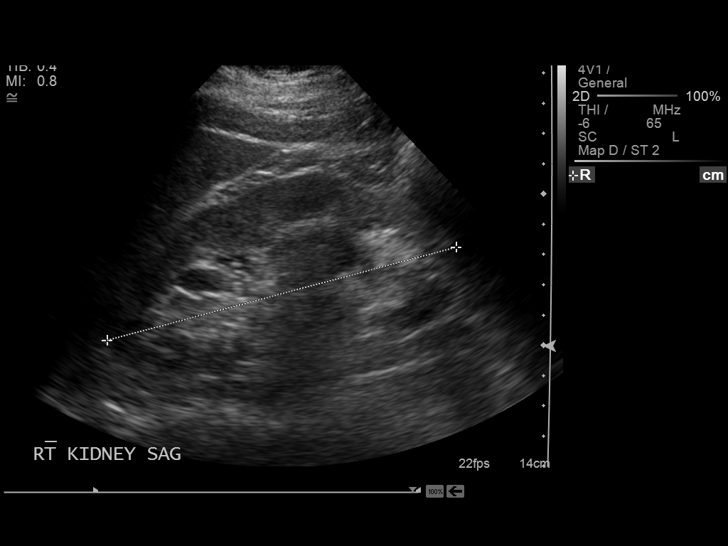
[im 8/46]
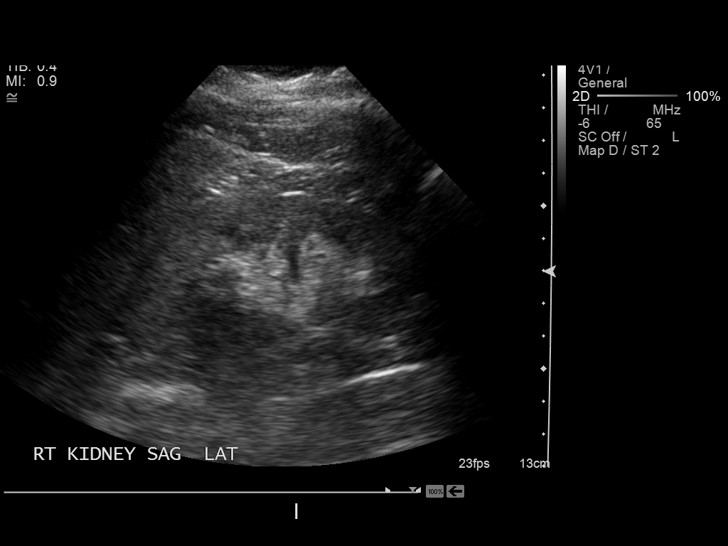
[im 12/46]
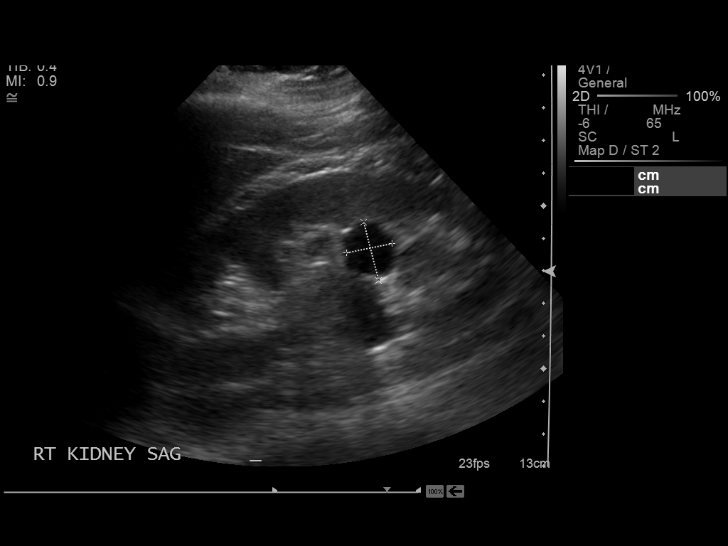
[im 16/46]
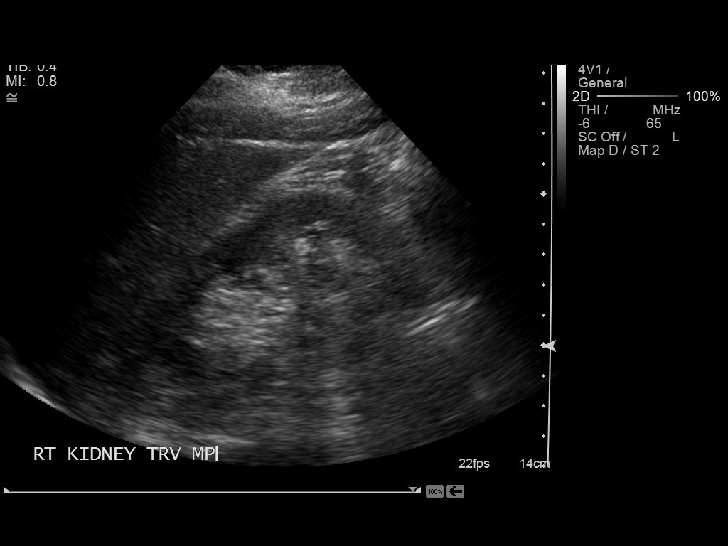
[im 17/46]
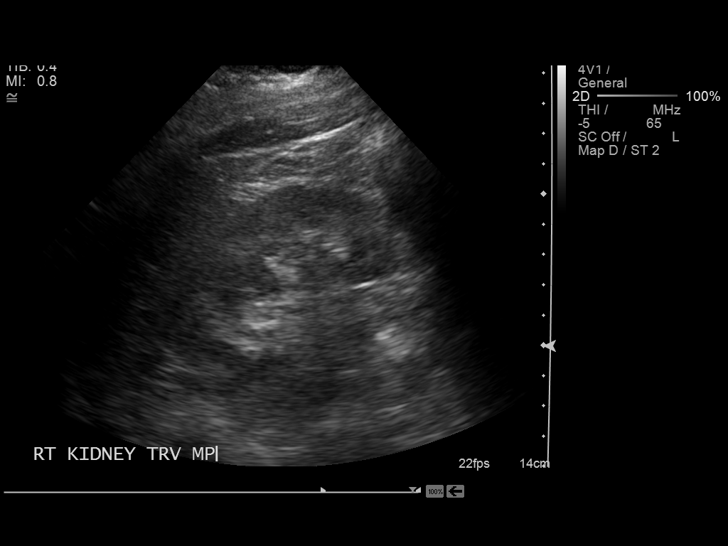
[im 21/46]
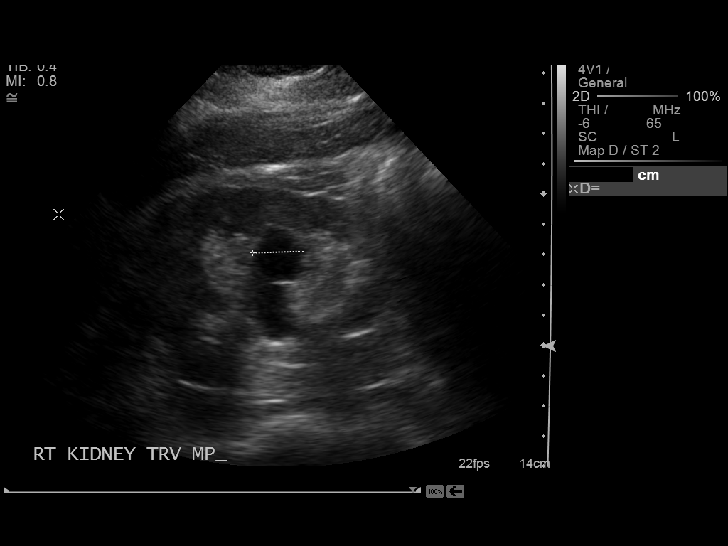
[im 25/46]
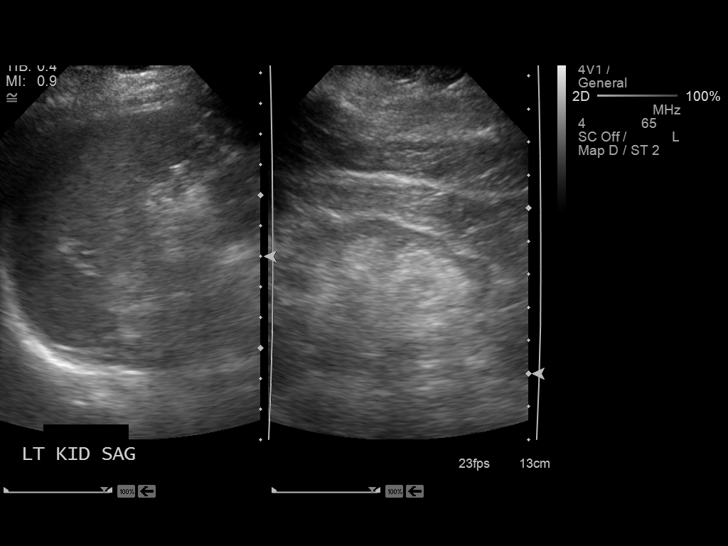
[im 29/46]
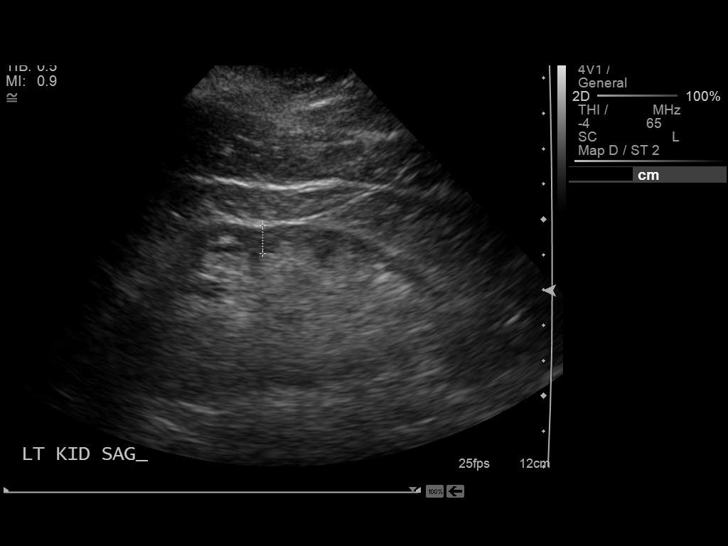
[im 31/46]
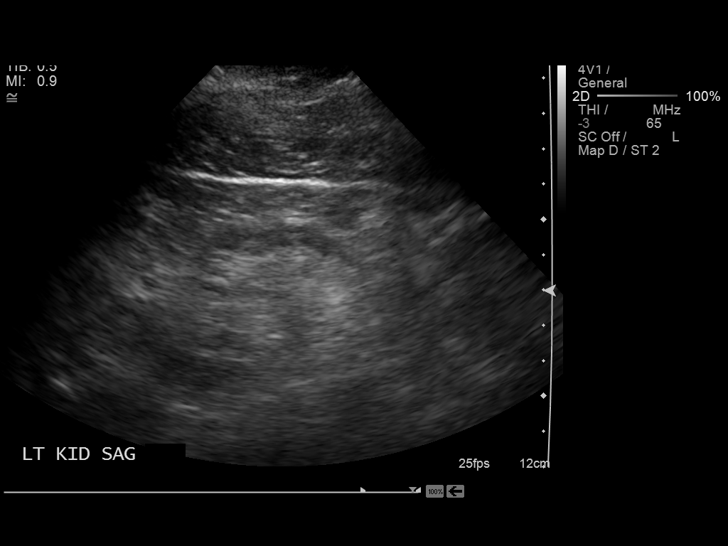
[im 34/46]
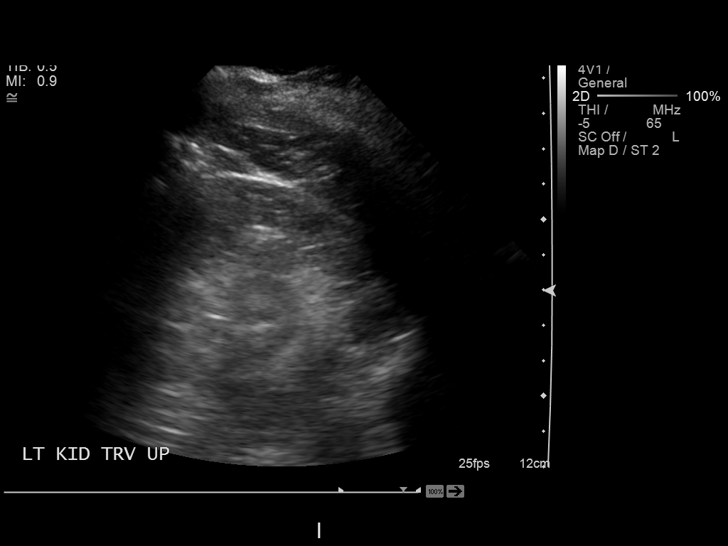
[im 38/46]
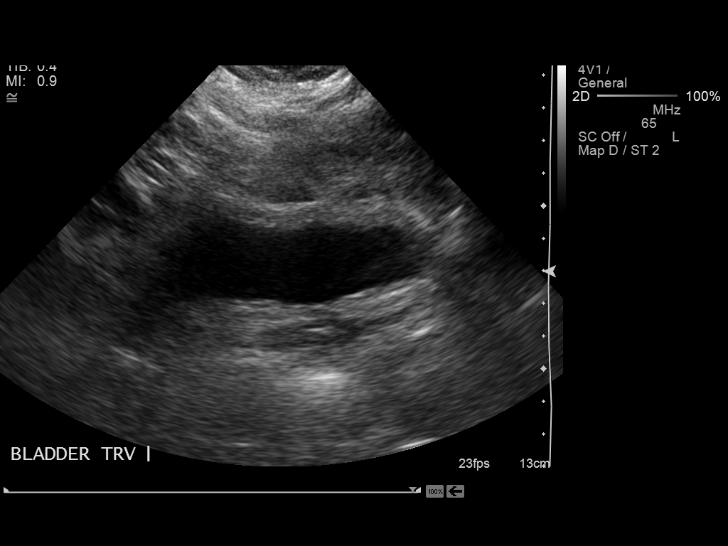
[im 42/46]
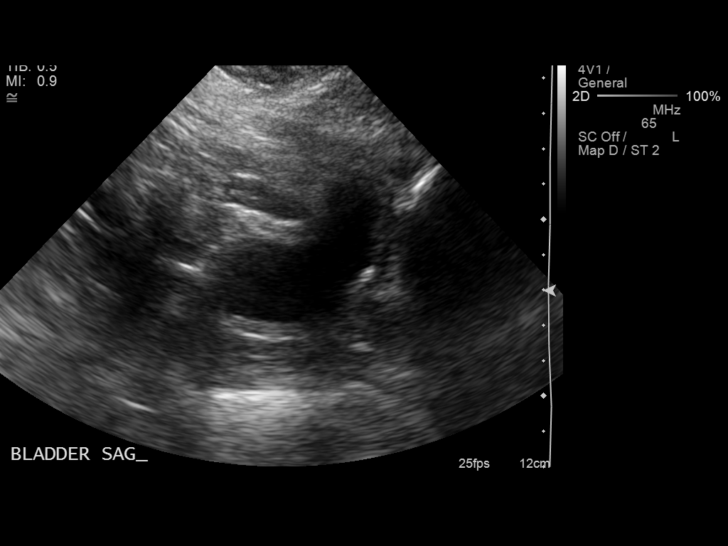
[im 46/46]
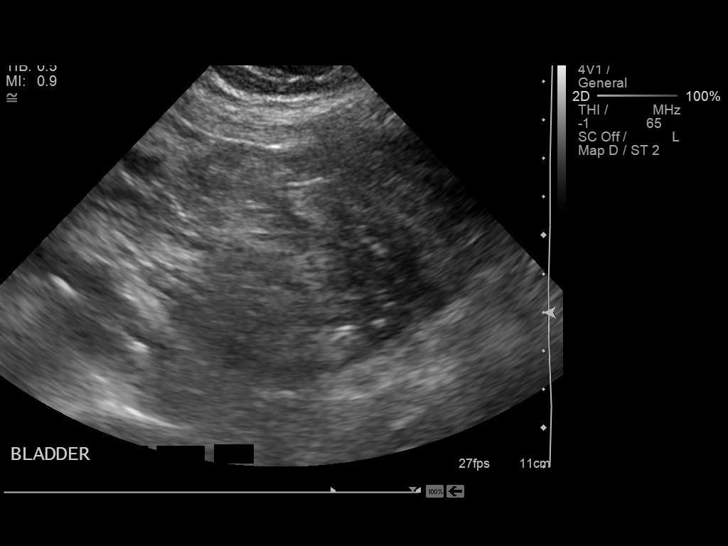

[14 of 25 positions shown; findings below may reference images not displayed]

FINDINGS: Right Kidney:  Normal in size and parenchymal echogenicity.  No
evidence of solid mass or hydronephrosis. Stable 14 x 19 mm
parapelvic cyst at the junction of the inter and lower pole
regions. And adjacent 10 x 18 mm cystic structure likely represents
a second parapelvic cyst which is also unchanged compared to prior
studies.

Left Kidney:  8.2 cm in length.  Mild diffuse cortical thinning.
No hydronephrosis. Previously seen pelvicaliectasis has resolved.
No focal solid lesion.

Bladder:  Appears normal for degree of bladder distention.  Pre
void bladder volume 45 ml.   No significant residual following
voiding.
IMPRESSION: 1.  No hydronephrosis.
2.  Similar appearance of small left kidney with mild diffuse
cortical thinning. The previously identified left pelvicaliectasis
has resolved.
3.  Right parapelvic cysts, unchanged

## 2012-12-25 ENCOUNTER — Telehealth: Payer: Self-pay | Admitting: Family Medicine

## 2012-12-27 NOTE — Telephone Encounter (Signed)
restasis isn't on medlist. Will have to wait until PCP returns

## 2012-12-31 MED ORDER — CYCLOSPORINE 0.05 % OP EMUL: 1.0000 [drp] | Freq: Two times a day (BID) | OPHTHALMIC | Status: DC

## 2012-12-31 NOTE — Telephone Encounter (Signed)
You can order the restasis, she needs to let us know what mail order

## 2013-02-18 ENCOUNTER — Telehealth: Payer: Self-pay | Admitting: Family Medicine

## 2013-03-01 NOTE — Telephone Encounter (Signed)
Ashley/[facility] agreed to call patient and explain their office set up for transferring.

## 2013-04-10 IMAGING — US US PELVIS COMPLETE
1 series · 13 of 25 positions shown · non-contrast
Comparison: None.

CLINICAL DATA: Post menopausal bleeding.



[Series 1: us pelvis complete · 0.25mm/px · 13 of 104 slices shown]
[im 1/104]
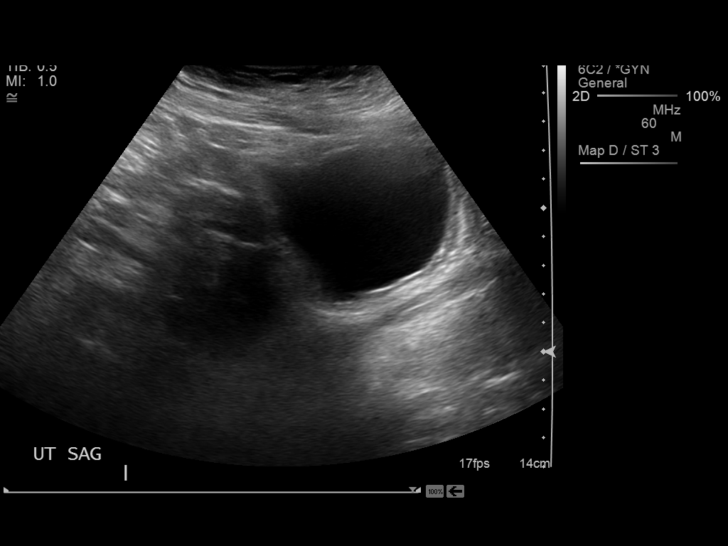
[im 9/104]
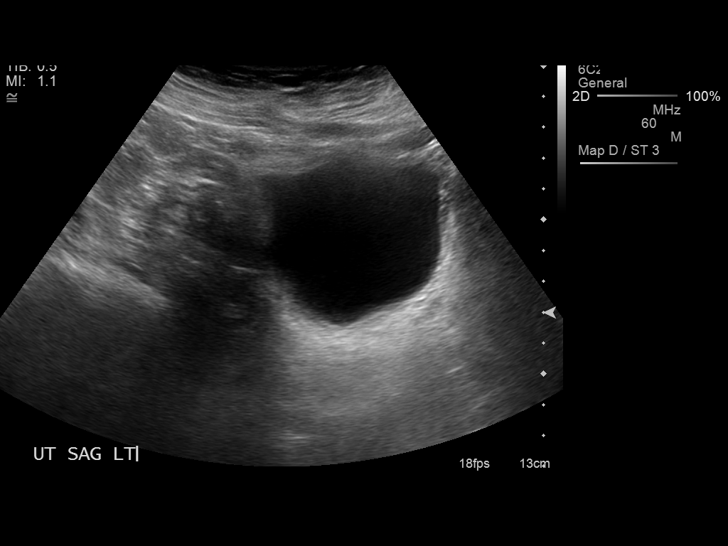
[im 18/104]
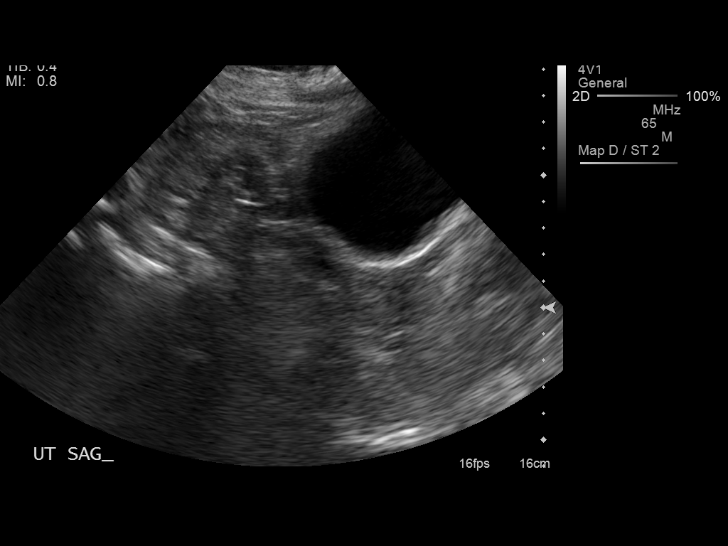
[im 26/104]
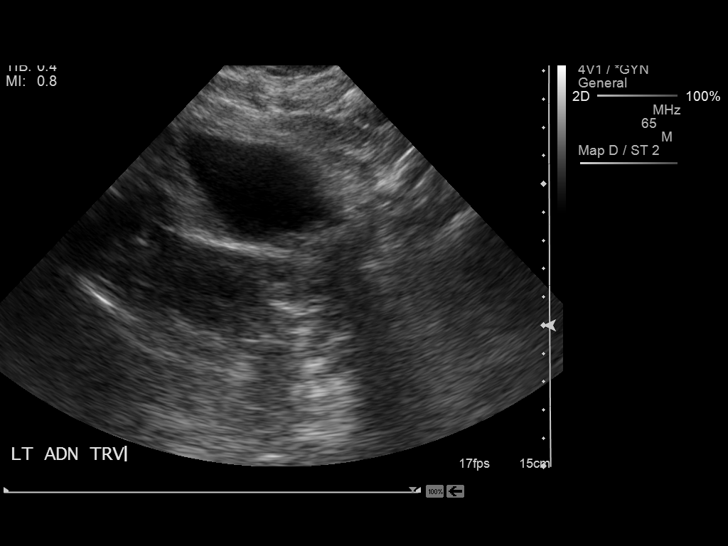
[im 35/104]
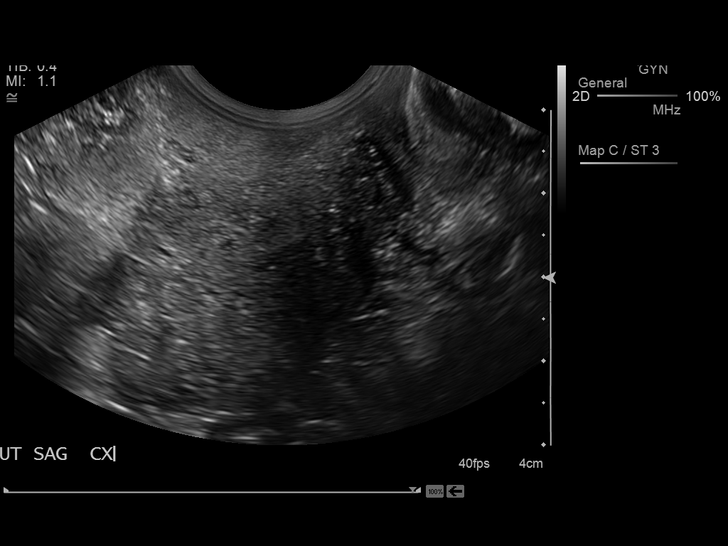
[im 43/104]
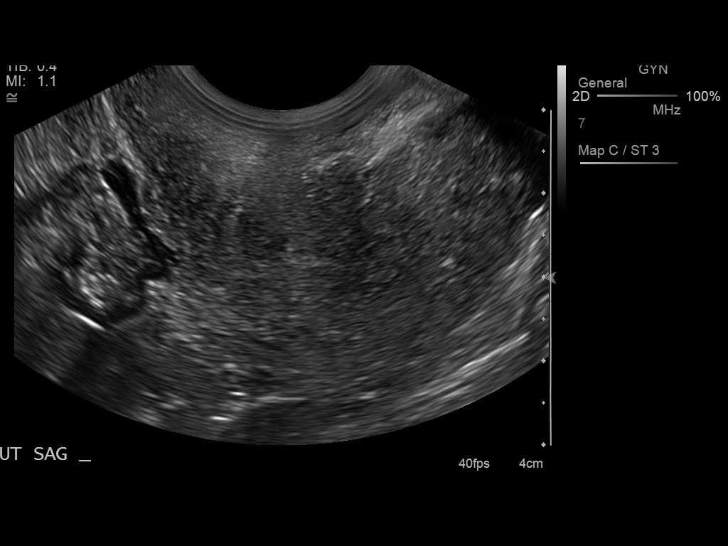
[im 52/104]
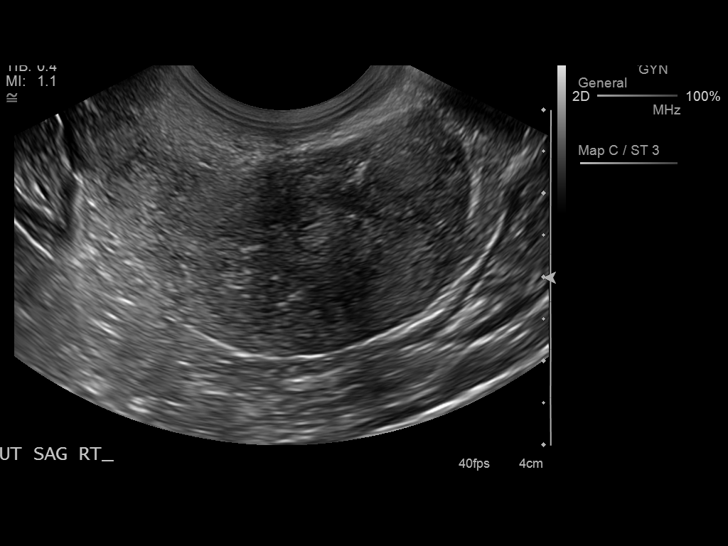
[im 61/104]
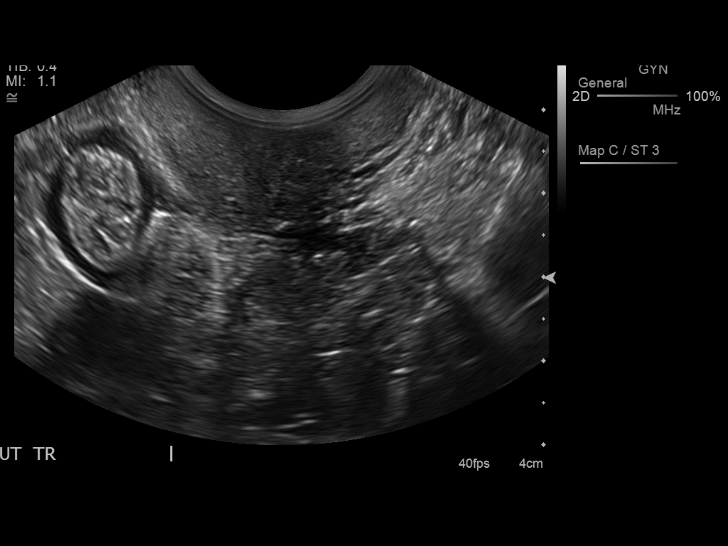
[im 69/104]
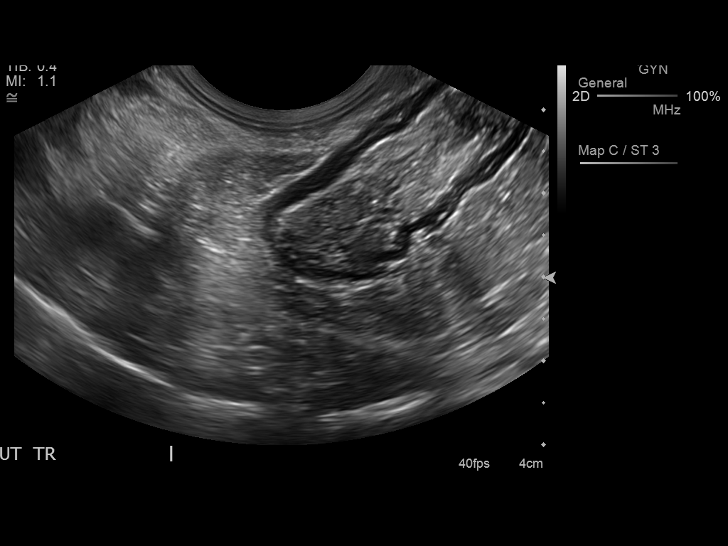
[im 78/104]
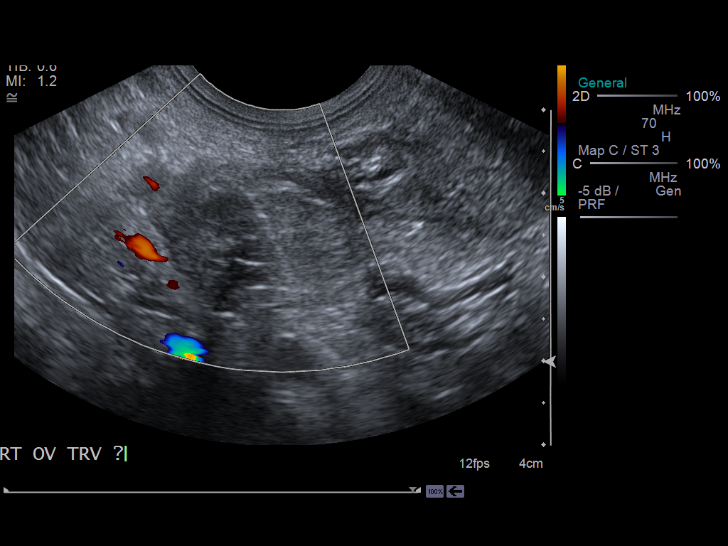
[im 86/104]
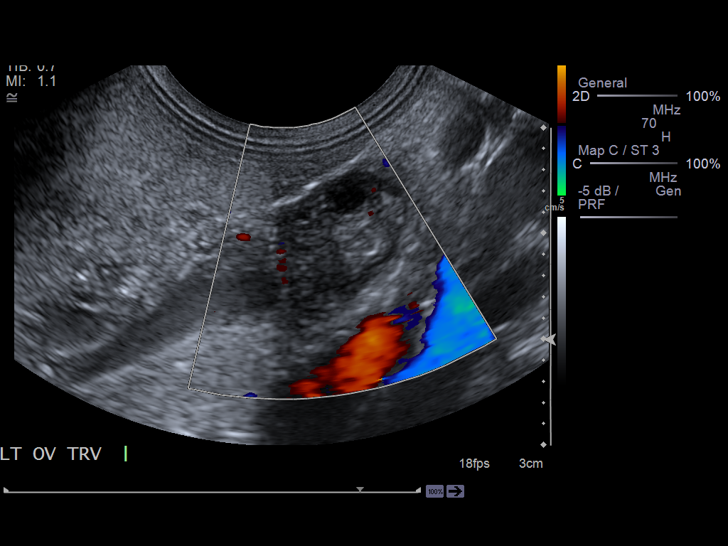
[im 95/104]
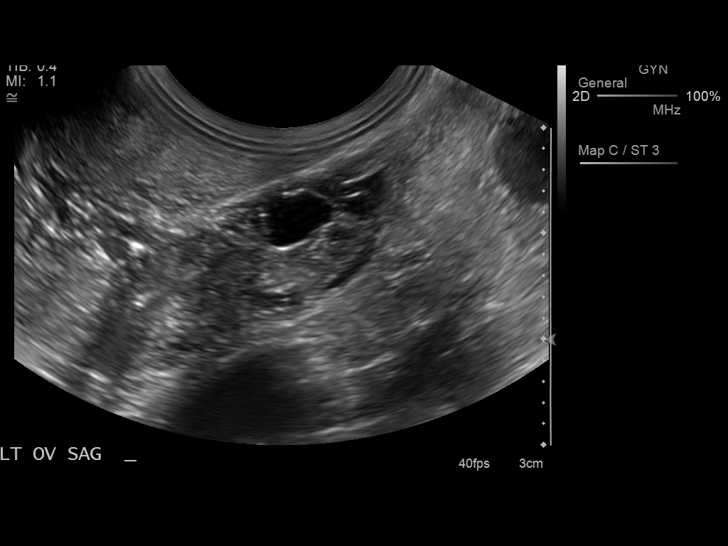
[im 104/104]
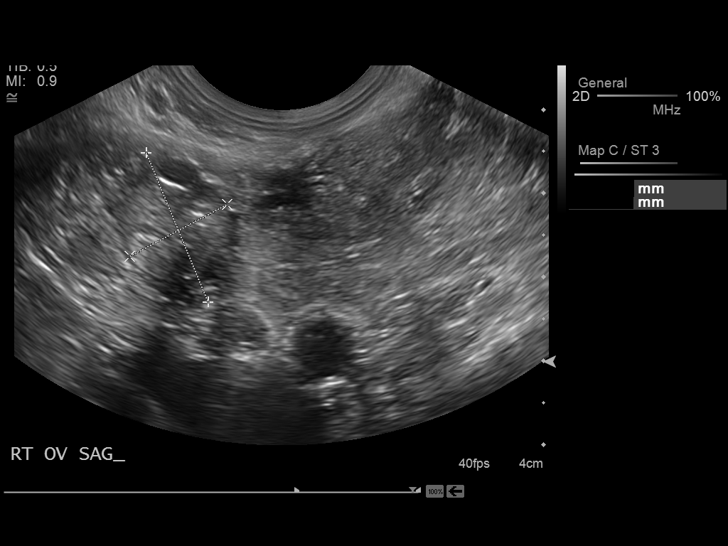

[13 of 25 positions shown; findings below may reference images not displayed]

FINDINGS: Uterus:  7.4 x 2.5 x 3.4 cm.  Retroverted.  No fibroids identified.

Endometrium: A small amount of free fluid is seen in the
endometrial cavity.  Double layer endometrial thickness measures 3
mm when excluding this fluid.

Right ovary: 1.9 x 1.3 x 1.3 cm.  Normal appearance.  No adnexal
mass identified.

Left ovary: 2.1 x 1.1 x 1.3 cm.  Normal appearance.  No adnexal
mass identified.

Other Findings:  No free fluid
IMPRESSION: 1.  Retroverted uterus.  Endometrialthickness measures 3 mm, with
small amount of fluid noted in the endometrial cavity. In the
setting of post-menopausal bleeding, this is consistent with a
benign etiology such as endometrial atrophy.  If bleeding remains
unresponsive to hormonal or medical therapy, sonohysterogram should
be considered for focal lesion work-up. (Ref:  Radiological
Reasoning: Algorithmic Workup of Abnormal Vaginal Bleeding with
Endovaginal Sonography and Sonohysterography. [id number]; 191:S68-73)
)
2.  Normal appearance of both ovaries.  No adnexal mass identified.

## 2013-04-30 ENCOUNTER — Encounter: Payer: Self-pay | Admitting: *Deleted

## 2013-05-07 ENCOUNTER — Encounter: Payer: Self-pay | Admitting: Family Medicine

## 2013-05-07 ENCOUNTER — Ambulatory Visit (INDEPENDENT_AMBULATORY_CARE_PROVIDER_SITE_OTHER): Payer: BC Managed Care – PPO | Admitting: Family Medicine

## 2013-05-07 VITALS — BP 118/80 | HR 78 | Temp 97.0°F | Resp 18 | Ht 64.0 in | Wt 204.0 lb

## 2013-05-07 DIAGNOSIS — Z0000 Encounter for general adult medical examination without abnormal findings: Secondary | ICD-10-CM

## 2013-05-07 DIAGNOSIS — M1990 Unspecified osteoarthritis, unspecified site: Secondary | ICD-10-CM

## 2013-05-07 DIAGNOSIS — I10 Essential (primary) hypertension: Secondary | ICD-10-CM

## 2013-05-07 DIAGNOSIS — E669 Obesity, unspecified: Secondary | ICD-10-CM

## 2013-05-07 DIAGNOSIS — E785 Hyperlipidemia, unspecified: Secondary | ICD-10-CM

## 2013-05-07 MED ORDER — SIMVASTATIN 20 MG PO TABS: 20.0000 mg | ORAL_TABLET | Freq: Every evening | ORAL | Status: AC

## 2013-05-07 MED ORDER — TRAMADOL HCL 50 MG PO TABS: 50.0000 mg | ORAL_TABLET | Freq: Two times a day (BID) | ORAL | Status: AC | PRN

## 2013-05-07 MED ORDER — LOSARTAN POTASSIUM-HCTZ 50-12.5 MG PO TABS: 1.0000 | ORAL_TABLET | Freq: Every day | ORAL | Status: AC

## 2013-05-07 NOTE — Assessment & Plan Note (Signed)
Continue to work on exercise

## 2013-05-07 NOTE — Patient Instructions (Addendum)
I recommend eye visit once a year I recommend dental visit every 6 months Goal is to  Exercise 30 minutes 5 days a week We will send a letter with lab results  I recommend Bone Density be Done in [place] We will obtain information about the PAP Smear  Good luck in [place] Ultram for pain twice a day as needed

## 2013-05-07 NOTE — Progress Notes (Signed)
Subjective:    Patient ID: Cheyenne Smith, female    DOB: 04/04/1946, 67 y.o.   MRN: [id number]  HPI Subjective:   Patient presents for Medicare Annual/Subsequent preventive examination.  She has no specific  Concerns She will be moving to [facility] therefore this is her last visit with me. There is ? If PAP Smear was done by GYN this year with evaluation of post menopausal bleeding Since her last visit she was seen by her ophthalmologist at [facility] who prescribed an antibiotic drop with steroid for painful eye. She's now on Systane drops for dry eye syndrome She continues to have pain with her arthritis and she is requesting medication for this. She was given tramadol back in January but she's not had anything refilled since then. She still going to the [facility] and a regular basis Medications and history reviewed  Review Past Medical/Family/Social: Reviewed per EMR   Risk Factors  Current exercise habits: Exercise on regular Dietary issues discussed: YES  Cardiac risk factors: Obesity (BMI >= 30 kg/m2).   Depression Screen  (Note: if answer to either of the following is "Yes", a more complete depression screening is indicated)  Over the past two weeks, have you felt down, depressed or hopeless? No Over the past two weeks, have you felt little interest or pleasure in doing things? No Have you lost interest or pleasure in daily life? No Do you often feel hopeless? No Do you cry easily over simple problems? No   Activities of Daily Living  In your present state of health, do you have any difficulty performing the following activities?:  Driving? No  Managing money? No  Feeding yourself? No  Getting from bed to chair? No  Climbing a flight of stairs? No  Preparing food and eating?: No  Bathing or showering? No  Getting dressed: No  Getting to the toilet? No  Using the toilet:No  Moving around from place to place: No  In the past year have you fallen or had a  near fall?:No  Are you sexually active? No  Do you have more than one partner? No   Hearing Difficulties: No  Do you often ask people to speak up or repeat themselves? No  Do you experience ringing or noises in your ears? No Do you have difficulty understanding soft or whispered voices? No  Do you feel that you have a problem with memory? No Do you often misplace items? No  Do you feel safe at home? Yes  Cognitive Testing  Alert? Yes Normal Appearance?Yes  Oriented to person? Yes Place? Yes  Time? Yes  Recall of three objects? Yes  Can perform simple calculations? Yes  Displays appropriate judgment?Yes  Can read the correct time from a watch face?Yes   List the Names of Other Physician/Practitioners you currently use: [facility], [facility] ophthalmology    Screening Tests / Date Colonoscopy  - 2009                  Zostavax - UTD Mammogram - Jan 2014 Influenza Vaccine - Due in Fall  PAP SMEAR- 2012 TDAP- UTD  Assessment:    Annual wellness medicare exam   Plan:    During the course of the visit the patient was educated and counseled about appropriate screening and preventive services including:  Screening mammography  Osteoporosis screening   Screen Negative for depression. Diet review for nutrition referral? Yes ____ Not Indicated __x__  Patient Instructions (the written  plan) was given to the patient.  Medicare Attestation  I have personally reviewed:  The patient's medical and social history  Their use of alcohol, tobacco or illicit drugs  Their current medications and supplements  The patient's functional ability including ADLs,fall risks, home safety risks, cognitive, and hearing and visual impairment  Diet and physical activities  The patient's weight, height, BMI, and visual acuity have been recorded in the chart. I have made referrals, counseling, and provided education to the patient based on review of the above and I have provided the  patient with a written personalized care plan for preventive services.        Review of Systems   GEN- denies fatigue, fever, weight loss,weakness, recent illness HEENT- denies eye drainage, change in vision, nasal discharge, CVS- denies chest pain, palpitations RESP- denies SOB, cough, wheeze ABD- denies N/V, change in stools, abd pain GU- denies dysuria, hematuria, dribbling, incontinence MSK- +joint pain, muscle aches, injury Neuro- denies headache, dizziness, syncope, seizure activity      Objective:   Physical Exam GEN- NAD, alert and oriented x3 HEENT- PERRL, EOMI, non injected sclera, pink conjunctiva, MMM, oropharynx clear Neck- Supple,  CVS- RRR, no murmur RESP-CTAB ABD-NABS,soft,NT,ND GU-Defferred EXT- No edema Pulses- Radial, DP- 2+        Assessment & Plan:

## 2013-05-07 NOTE — Assessment & Plan Note (Signed)
Blood pressure well controlled, obtain fasting labs today

## 2013-05-07 NOTE — Assessment & Plan Note (Signed)
Recheck fasting lipid panel on simvastatin 20mg 

## 2013-05-07 NOTE — Assessment & Plan Note (Signed)
Restart ultram twice a day as needed

## 2013-05-08 LAB — COMPREHENSIVE METABOLIC PANEL
CO2: 30 mEq/L (ref 19–32)
Calcium: 9.9 mg/dL (ref 8.4–10.5)
Creat: 1.2 mg/dL — ABNORMAL HIGH (ref 0.50–1.10)
Glucose, Bld: 93 mg/dL (ref 70–99)
Total Bilirubin: 0.4 mg/dL (ref 0.3–1.2)

## 2013-05-08 LAB — LIPID PANEL
HDL: 72 mg/dL (ref >39)
LDL Cholesterol: 102 mg/dL — ABNORMAL HIGH (ref 0–99)
Total CHOL/HDL Ratio: 2.7 Ratio
Triglycerides: 109 mg/dL (ref <150)

## 2013-05-08 LAB — CBC
HCT: 36.6 % (ref 36.0–46.0)
MCV: 93.4 fL (ref 78.0–100.0)
Platelets: 270 10*3/uL (ref 150–400)
RBC: 3.92 MIL/uL (ref 3.87–5.11)
WBC: 5 10*3/uL (ref 4.0–10.5)

## 2013-05-16 ENCOUNTER — Encounter: Payer: Self-pay | Admitting: Family Medicine

## 2013-05-16 ENCOUNTER — Telehealth: Payer: Self-pay | Admitting: Family Medicine

## 2013-05-16 NOTE — Telephone Encounter (Signed)
Faxed over request for last ov and pap smear to [facility]

## 2014-04-22 ENCOUNTER — Telehealth: Payer: Self-pay | Admitting: *Deleted

## 2014-04-22 ENCOUNTER — Encounter: Payer: Self-pay | Admitting: *Deleted

## 2014-04-22 NOTE — Telephone Encounter (Signed)
Received fax back from [facility] requesting lab results with pts release of records and signature, sent lab results to facility.

## 2014-04-22 NOTE — Telephone Encounter (Signed)
Cheyenne Smith from [facility], [place] called stating she is needing all lab results from us faxed over to them, I called pt to see if legite pending return call, I also faxed to [facility] [phone] stating needing letter head of information for lab results and release of records with pt signature on it before can fax over.

## 2018-04-03 ENCOUNTER — Encounter: Payer: Self-pay | Admitting: Gastroenterology
